# Patient Record
Sex: Male | Born: 1975
Health system: Southern US, Community
[De-identification: ages and names within clinical notes are randomized; demographics above are authoritative.]

## PROBLEM LIST (undated history)

## (undated) DIAGNOSIS — Z8619 Personal history of other infectious and parasitic diseases: Secondary | ICD-10-CM

## (undated) DIAGNOSIS — Z87898 Personal history of other specified conditions: Secondary | ICD-10-CM

## (undated) DIAGNOSIS — N5089 Other specified disorders of the male genital organs: Secondary | ICD-10-CM

## (undated) DIAGNOSIS — I10 Essential (primary) hypertension: Secondary | ICD-10-CM

## (undated) DIAGNOSIS — K219 Gastro-esophageal reflux disease without esophagitis: Secondary | ICD-10-CM

## (undated) DIAGNOSIS — R350 Frequency of micturition: Secondary | ICD-10-CM

## (undated) HISTORY — DX: Gastro-esophageal reflux disease without esophagitis: K21.9

## (undated) HISTORY — PX: NO PAST SURGERIES: SHX2092

---

## 2012-11-16 ENCOUNTER — Emergency Department (HOSPITAL_COMMUNITY)
Admission: EM | Admit: 2012-11-16 | Discharge: 2012-11-16 | Disposition: A | Discharge status: Home / Self Care | Payer: Self-pay | Attending: Emergency Medicine | Admitting: Emergency Medicine

## 2012-11-16 ENCOUNTER — Encounter (HOSPITAL_COMMUNITY): Payer: Self-pay | Admitting: *Deleted

## 2012-11-16 ENCOUNTER — Emergency Department (HOSPITAL_COMMUNITY): Payer: Self-pay

## 2012-11-16 DIAGNOSIS — Z87891 Personal history of nicotine dependence: Secondary | ICD-10-CM | POA: Insufficient documentation

## 2012-11-16 DIAGNOSIS — R059 Cough, unspecified: Secondary | ICD-10-CM | POA: Insufficient documentation

## 2012-11-16 DIAGNOSIS — R0789 Other chest pain: Secondary | ICD-10-CM | POA: Insufficient documentation

## 2012-11-16 DIAGNOSIS — R209 Unspecified disturbances of skin sensation: Secondary | ICD-10-CM | POA: Insufficient documentation

## 2012-11-16 DIAGNOSIS — R05 Cough: Secondary | ICD-10-CM | POA: Insufficient documentation

## 2012-11-16 LAB — COMPREHENSIVE METABOLIC PANEL
ALT: 32 U/L (ref 0–53)
AST: 24 U/L (ref 0–37)
Albumin: 3.9 g/dL (ref 3.5–5.2)
Alkaline Phosphatase: 104 U/L (ref 39–117)
BUN: 10 mg/dL (ref 6–23)
Calcium: 9.4 mg/dL (ref 8.4–10.5)
Chloride: 101 mEq/L (ref 96–112)
Potassium: 3.5 mEq/L (ref 3.5–5.1)
Sodium: 139 mEq/L (ref 135–145)
Total Bilirubin: 0.3 mg/dL (ref 0.3–1.2)
Total Protein: 7.6 g/dL (ref 6.0–8.3)

## 2012-11-16 LAB — CBC
HCT: 43.6 % (ref 39.0–52.0)
MCV: 84.2 fL (ref 78.0–100.0)
Platelets: 245 10*3/uL (ref 150–400)
RDW: 13.3 % (ref 11.5–15.5)
WBC: 7.4 10*3/uL (ref 4.0–10.5)

## 2012-11-16 MED ORDER — NITROGLYCERIN 0.4 MG SL SUBL
0.4000 mg | SUBLINGUAL_TABLET | SUBLINGUAL | Status: DC | PRN
  Filled 2012-11-16 (×2): qty 25

## 2012-11-16 MED ORDER — BENZONATATE 100 MG PO CAPS: 100.0000 mg | ORAL_CAPSULE | Freq: Three times a day (TID) | ORAL | Status: DC | PRN

## 2012-11-16 MED ORDER — ASPIRIN 81 MG PO CHEW
324.0000 mg | CHEWABLE_TABLET | Freq: Once | ORAL | Status: AC
  Administered 2012-11-16: 324 mg via ORAL
  Filled 2012-11-16: qty 4

## 2012-11-16 MED ORDER — METHOCARBAMOL 500 MG PO TABS: 1000.0000 mg | ORAL_TABLET | Freq: Four times a day (QID) | ORAL | Status: DC | PRN

## 2012-11-16 NOTE — ED Notes (Signed)
Pt c/o L sided chest pain since this morning. Describes pain as a squeezing.  Pt is non-diaphoretic and denies sob, but c/o some nausea.

## 2012-11-16 NOTE — ED Provider Notes (Signed)
Medical screening examination/treatment/procedure(s) were conducted as a shared visit with non-physician practitioner(s) and myself.  I personally evaluated the patient during the encounter.   Please see my separate note.     Candyce Churn, MD 11/16/12 [phone]

## 2012-11-16 NOTE — ED Provider Notes (Signed)
CSN: [id number]     Arrival date & time 11/16/12  1622 History   First MD Initiated Contact with Patient 11/16/12 1634     Chief Complaint  Patient presents with  . Chest Pain   (Consider location/radiation/quality/duration/timing/severity/associated sxs/prior Treatment) HPI  Rayon Barrie Dunker is a 37 y.o. male complaining of 2x episodes of palpitations starting yesterday and left sided CP, described as pressure-like and tight onset 08:30 AM today while Pt was working (non-physically strenuous job Horticulturist, commercial). Pain is 5/10, it has been constant but is exacerbated by deep breathing and coughing associated with tingling sensation in lips. Pt denies fever, N/V, SOB, diaphoresis, h/o DVT/PE. Pt states he was stressed yesterday because he received a notice that his water was being turned off.  Pt is former smoker 3 pack years, quit 11 years ago. No other PMH, but Pt does not have PCP. Has lip swelling with ibuprofen, but has not had reaction with ASA.    History reviewed. No pertinent past medical history. History reviewed. No pertinent past surgical history. No family history on file. History  Substance Use Topics  . Smoking status: Former Games developer  . Smokeless tobacco: Not on file  . Alcohol Use: No    Review of Systems  10 systems reviewed and found to be negative, except as noted in the HPI   Allergies  Ibuprofen lip swelling  Home Medications  No current outpatient prescriptions on file. BP 136/89  Pulse 81  Temp(Src) 98.5 F (36.9 C) (Oral)  Resp 18  Ht 5\' 5"  (1.651 m)  Wt 180 lb 9.6 oz (81.92 kg)  BMI 30.05 kg/m2  SpO2 100% Physical Exam  Nursing note and vitals reviewed. Constitutional: He is oriented to person, place, and time. He appears well-developed and well-nourished. No distress.  HENT:  Head: Normocephalic.  Mouth/Throat: Oropharynx is clear and moist. No oropharyngeal exudate.  Eyes: Conjunctivae and EOM are normal. Pupils are equal, round, and  reactive to light.  Neck: Neck supple. No JVD present.  Cardiovascular: Normal rate, regular rhythm and intact distal pulses.   Pulmonary/Chest: Effort normal and breath sounds normal. No stridor. No respiratory distress. He has no wheezes. He has no rales. He exhibits no tenderness.  Musculoskeletal: Normal range of motion. He exhibits no edema.  No calf asymmetry, superficial collaterals, palpable cords, edema, Homans sign negative bilaterally.    Neurological: He is alert and oriented to person, place, and time.  Psychiatric: He has a normal mood and affect.    ED Course  Procedures (including critical care time) Labs Review Labs Reviewed  CBC  COMPREHENSIVE METABOLIC PANEL  POCT I-STAT TROPONIN I   Imaging Review No results found.   Date: 11/16/2012  Rate: 79  Rhythm: normal sinus rhythm  QRS Axis: normal  Intervals: normal  ST/T Wave abnormalities: normal  Conduction Disutrbances:none  Narrative Interpretation:   Old EKG Reviewed: none available    MDM   1. Atypical chest pain   2. Cough    Filed Vitals:   11/16/12 1628  BP: 136/89  Pulse: 81  Temp: 98.5 F (36.9 C)  TempSrc: Oral  Resp: 18  Height: 5\' 5"  (1.651 m)  Weight: 180 lb 9.6 oz (81.92 kg)  SpO2: 100%     Youcef Barrie Dunker is a 38 y.o. male with left sided CP onset at 08:30 AM, preceded by palpitations yesterday. ECG is nonischemic and troponin is negative. Pt is low risk by wells criteria and PERC negative. Blood work and troponin are  negative. CXR shows no infiltrate. Doubt cardiac origin as pain has been constant since 08:30 AM and troponin is negative. likely chest wall inflamation from cough.   Medications  nitroGLYCERIN (NITROSTAT) SL tablet 0.4 mg (0.4 mg Sublingual Not Given 11/16/12 1730)  aspirin chewable tablet 324 mg (324 mg Oral Given 11/16/12 1725)    Pt is hemodynamically stable, appropriate for, and amenable to discharge at this time. Pt verbalized understanding and agrees with  care plan. All questions answered. Outpatient follow-up and specific return precautions discussed.    New Prescriptions   BENZONATATE (TESSALON) 100 MG CAPSULE    Take 1 capsule (100 mg total) by mouth 3 (three) times daily as needed for cough.   METHOCARBAMOL (ROBAXIN) 500 MG TABLET    Take 2 tablets (1,000 mg total) by mouth 4 (four) times daily as needed (Pain).    Note: Portions of this report may have been transcribed using voice recognition software. Every effort was made to ensure accuracy; however, inadvertent computerized transcription errors may be present      Wynetta Emery, PA-C 11/16/12 1810

## 2012-11-16 NOTE — ED Provider Notes (Signed)
Medical screening examination/treatment/procedure(s) were conducted as a shared visit with non-physician practitioner(s) and myself.  I personally evaluated the patient during the encounter  37-year-old male with no significant past medical history presenting with chest pain and cough. Chest pain started this morning has been constant.  Well-appearing, lungs clear to auscultation bilaterally with good air movement, heart sounds normal with regular rate and rhythm. Mild chest tenderness on the left anterior chest. Story does not sound consistent with ACS or PE. Pertinent negative. Initial troponin negative and pain has been constant for many hours. Chest x-ray shows no pneumonia. Feel that he is safe for discharge.  Clinical Impression: 1. Atypical chest pain   2. Cough       Candyce Churn, MD 11/16/12 [id number]

## 2013-01-10 ENCOUNTER — Telehealth: Payer: Self-pay | Admitting: *Deleted

## 2013-01-10 ENCOUNTER — Encounter: Payer: Self-pay | Admitting: Internal Medicine

## 2013-01-10 ENCOUNTER — Ambulatory Visit: Payer: No Typology Code available for payment source | Attending: Internal Medicine | Admitting: Internal Medicine

## 2013-01-10 VITALS — BP 155/95 | HR 76 | Temp 98.8°F | Resp 14 | Ht 66.0 in | Wt 185.0 lb

## 2013-01-10 DIAGNOSIS — R1012 Left upper quadrant pain: Secondary | ICD-10-CM

## 2013-01-10 DIAGNOSIS — Z23 Encounter for immunization: Secondary | ICD-10-CM

## 2013-01-10 DIAGNOSIS — R51 Headache: Secondary | ICD-10-CM | POA: Insufficient documentation

## 2013-01-10 LAB — CBC WITH DIFFERENTIAL/PLATELET
Basophils Relative: 0 % (ref 0–1)
Eosinophils Relative: 2 % (ref 0–5)
Lymphocytes Relative: 28 % (ref 12–46)
Lymphs Abs: 1.5 10*3/uL (ref 0.7–4.0)
MCH: 30.4 pg (ref 26.0–34.0)
Monocytes Absolute: 0.5 10*3/uL (ref 0.1–1.0)
Neutro Abs: 3.2 10*3/uL (ref 1.7–7.7)
Neutrophils Relative %: 60 % (ref 43–77)
Platelets: 265 10*3/uL (ref 150–400)
RBC: 5.43 MIL/uL (ref 4.22–5.81)
WBC: 5.3 10*3/uL (ref 4.0–10.5)

## 2013-01-10 MED ORDER — TOPIRAMATE 25 MG PO TABS: 25.0000 mg | ORAL_TABLET | Freq: Two times a day (BID) | ORAL | Status: DC

## 2013-01-10 NOTE — Progress Notes (Signed)
Patient ID: Darren Burns, male   DOB: 05/25/1975, 37 y.o.   MRN: [id number]   CC:  HPI:  37 year old male who is here to establish care. The patient complains of left upper quadrant pain, worse with eating, not associated with any nausea vomiting but does have a lot of bloating. He denies use of alcohol. He denies any history of peptic ulcer disease. He denies any previous abdominal surgeries Denies any weight loss, hematochezia, he states that he has gained weight  He also complains of headache which is had for the last 4 years, symptoms are worse in the morning Associated with occasional dizziness, no changes in his vision or hearing   Allergies  Allergen Reactions  . Ibuprofen   . Tamiflu [Oseltamivir Phosphate] Nausea And Vomiting   History reviewed. No pertinent past medical history. Current Outpatient Prescriptions on File Prior to Visit  Medication Sig Dispense Refill  . benzonatate (TESSALON) 100 MG capsule Take 1 capsule (100 mg total) by mouth 3 (three) times daily as needed for cough.  21 capsule  0  . methocarbamol (ROBAXIN) 500 MG tablet Take 2 tablets (1,000 mg total) by mouth 4 (four) times daily as needed (Pain).  20 tablet  0   No current facility-administered medications on file prior to visit.   History reviewed. No pertinent family history. History   Social History  . Marital Status: Married    Spouse Name: N/A    Number of Children: N/A  . Years of Education: N/A   Occupational History  . Not on file.   Social History Main Topics  . Smoking status: Former Games developer  . Smokeless tobacco: Not on file  . Alcohol Use: No  . Drug Use: No  . Sexual Activity: Not on file   Other Topics Concern  . Not on file   Social History Narrative  . No narrative on file    Review of Systems  Constitutional: Negative for fever, chills, diaphoresis, activity change, appetite change and fatigue.  HENT: Negative for ear pain, nosebleeds, congestion, facial  swelling, rhinorrhea, neck pain, neck stiffness and ear discharge.   Eyes: Negative for pain, discharge, redness, itching and visual disturbance.  Respiratory: Negative for cough, choking, chest tightness, shortness of breath, wheezing and stridor.   Cardiovascular: Negative for chest pain, palpitations and leg swelling.  Gastrointestinal: Negative for abdominal distention.  Genitourinary: Negative for dysuria, urgency, frequency, hematuria, flank pain, decreased urine volume, difficulty urinating and dyspareunia.  Musculoskeletal: Negative for back pain, joint swelling, arthralgias and gait problem.  Neurological: Negative for dizziness, tremors, seizures, syncope, facial asymmetry, speech difficulty, weakness, light-headedness, numbness and headaches.  Hematological: Negative for adenopathy. Does not bruise/bleed easily.  Psychiatric/Behavioral: Negative for hallucinations, behavioral problems, confusion, dysphoric mood, decreased concentration and agitation.    Objective:   Filed Vitals:   01/10/13 0950  BP: 155/95  Pulse: 76  Temp: 98.8 F (37.1 C)  Resp: 14    Physical Exam  Constitutional: Appears well-developed and well-nourished. No distress.  HENT: Normocephalic. External right and left ear normal. Oropharynx is clear and moist.  Eyes: Conjunctivae and EOM are normal. PERRLA, no scleral icterus.  Neck: Normal ROM. Neck supple. No JVD. No tracheal deviation. No thyromegaly.  CVS: RRR, S1/S2 +, no murmurs, no gallops, no carotid bruit.  Pulmonary: Effort and breath sounds normal, no stridor, rhonchi, wheezes, rales.  Abdominal: Soft. BS +,  no distension, tenderness, rebound or guarding.  Musculoskeletal: Normal range of motion. No edema and no tenderness.  Lymphadenopathy: No lymphadenopathy noted, cervical, inguinal. Neuro: Alert. Normal reflexes, muscle tone coordination. No cranial nerve deficit. Skin: Skin is warm and dry. No rash noted. Not diaphoretic. No erythema. No  pallor.  Psychiatric: Normal mood and affect. Behavior, judgment, thought content normal.   Lab Results  Component Value Date   WBC 7.4 11/16/2012   HGB 15.9 11/16/2012   HCT 43.6 11/16/2012   MCV 84.2 11/16/2012   PLT 245 11/16/2012   Lab Results  Component Value Date   CREATININE 0.94 11/16/2012   BUN 10 11/16/2012   NA 139 11/16/2012   K 3.5 11/16/2012   CL 101 11/16/2012   CO2 27 11/16/2012    No results found for this basename: HGBA1C   Lipid Panel  No results found for this basename: chol, trig, hdl, cholhdl, vldl, ldlcalc       Assessment and plan:   There are no active problems to display for this patient.      chronic headache We'll obtain a CT without contrast and start the patient on Topamax for headache prophylaxis If no improvement the patient will be referred to neurology   Chronic abdominal pain left upper quadrant Limited CT abdomen pelvis obtain baseline labs including CMP, kidney function  The patient also wants to see a urologist for vasectomy as well as testicular swelling   The patient was given clear instructions to go to ER or return to medical center if symptoms don't improve, worsen or new problems develop. The patient verbalized understanding. The patient was told to call to get any lab results if not heard anything in the next week.

## 2013-01-10 NOTE — Telephone Encounter (Signed)
Called pt and received no answer. Left a message on the voicemail informing pt of his CT scan appointment on 01/18/13 AT 9:30am with all instructions for appointment.

## 2013-01-10 NOTE — Progress Notes (Signed)
Pt is here to establish care. Pt complains of constant throbbing headaches x2 days in the RT side of forehead above eyebrow. Pt also complains of LT side abdominal pain after eating/drinking x3 months; discomfort and stabbing pain, feels more like inflammation. Pt has had moments of pre-hypertension in the past few weeks. BP today is 155/95; may be causing his headaches.

## 2013-01-11 LAB — COMPLETE METABOLIC PANEL WITH GFR
BUN: 12 mg/dL (ref 6–23)
CO2: 28 mEq/L (ref 19–32)
Calcium: 9.5 mg/dL (ref 8.4–10.5)
Chloride: 103 mEq/L (ref 96–112)
Creat: 0.76 mg/dL (ref 0.50–1.35)
GFR, Est African American: >89 mL/min
Glucose, Bld: 86 mg/dL (ref 70–99)
Sodium: 137 mEq/L (ref 135–145)

## 2013-01-18 ENCOUNTER — Ambulatory Visit (HOSPITAL_COMMUNITY)
Admission: RE | Admit: 2013-01-18 | Discharge: 2013-01-18 | Disposition: A | Discharge status: Home / Self Care | Payer: No Typology Code available for payment source | Source: Ambulatory Visit | Attending: Internal Medicine | Admitting: Internal Medicine

## 2013-01-18 ENCOUNTER — Encounter (HOSPITAL_COMMUNITY): Payer: Self-pay

## 2013-01-18 DIAGNOSIS — K5730 Diverticulosis of large intestine without perforation or abscess without bleeding: Secondary | ICD-10-CM | POA: Insufficient documentation

## 2013-01-18 DIAGNOSIS — R1012 Left upper quadrant pain: Secondary | ICD-10-CM

## 2013-01-18 DIAGNOSIS — R109 Unspecified abdominal pain: Secondary | ICD-10-CM | POA: Insufficient documentation

## 2013-01-18 DIAGNOSIS — K429 Umbilical hernia without obstruction or gangrene: Secondary | ICD-10-CM | POA: Insufficient documentation

## 2013-01-18 MED ORDER — IOHEXOL 300 MG/ML  SOLN
80.0000 mL | Freq: Once | INTRAMUSCULAR | Status: AC | PRN
  Administered 2013-01-18: 80 mL via INTRAVENOUS

## 2013-02-12 ENCOUNTER — Ambulatory Visit: Payer: No Typology Code available for payment source | Admitting: Internal Medicine

## 2013-04-11 ENCOUNTER — Ambulatory Visit: Payer: No Typology Code available for payment source | Admitting: Internal Medicine

## 2013-06-18 ENCOUNTER — Emergency Department (HOSPITAL_COMMUNITY)
Admission: EM | Admit: 2013-06-18 | Discharge: 2013-06-18 | Disposition: A | Discharge status: Home / Self Care | Payer: No Typology Code available for payment source | Source: Home / Self Care | Attending: Emergency Medicine | Admitting: Emergency Medicine

## 2013-06-18 ENCOUNTER — Encounter (HOSPITAL_COMMUNITY): Payer: Self-pay | Admitting: Emergency Medicine

## 2013-06-18 DIAGNOSIS — J019 Acute sinusitis, unspecified: Secondary | ICD-10-CM

## 2013-06-18 DIAGNOSIS — R109 Unspecified abdominal pain: Secondary | ICD-10-CM

## 2013-06-18 DIAGNOSIS — G5600 Carpal tunnel syndrome, unspecified upper limb: Secondary | ICD-10-CM

## 2013-06-18 DIAGNOSIS — G44209 Tension-type headache, unspecified, not intractable: Secondary | ICD-10-CM

## 2013-06-18 MED ORDER — TIZANIDINE HCL 4 MG PO TABS: ORAL_TABLET | ORAL | Status: DC

## 2013-06-18 MED ORDER — AMITRIPTYLINE HCL 25 MG PO TABS: 25.0000 mg | ORAL_TABLET | Freq: Every day | ORAL | Status: DC

## 2013-06-18 MED ORDER — AMOXICILLIN 500 MG PO CAPS: 1000.0000 mg | ORAL_CAPSULE | Freq: Three times a day (TID) | ORAL | Status: DC

## 2013-06-18 NOTE — ED Notes (Signed)
C/o  Headache, back left side of head radiating to the front of the head.  Pressure behind left eye.   Also having in numbness in left hand/left side of body off/on.   BP elevated when checked at [facility].  Denies hx of hypertension.

## 2013-06-18 NOTE — ED Provider Notes (Signed)
Chief Complaint   Chief Complaint  Patient presents with  . Headache  . Numbness    History of Present Illness   Darren Burns is a 38 year old male who presents today with headaches and a number of other problems. The current headache has been going on for about 2 weeks, but he has had headaches going on since at least last fall when he was seen last by his primary care physician. He describes the headache as left occipital and nuchal radiating around to the frontal area. Comes and goes and is worse with flexion of the neck. He feels pressure behind both eyes and dry eyes. His left hand is numb in the morning when he first wakes up. This is particularly noticeable over the thumb, index, and middle fingers. This tends to go away if he moves around or shakes his hands. His feet feel numb off and on. He's had occasional heart flutters and chest tightness. He feels stressed and anxious. He's having difficulty sleeping at night. He's had some blurring of his vision lasting a few seconds at a time. He's felt chills. He also has had some pain in his left flank area. He was seen for these problems and others by her primary care physician last November. He had a complete workup including CT scan of the abdomen and head, complete blood work including a troponin all of which were negative. His EKG was within normal limits. He was given Topamax and Robaxin and told to followup with urology and neurology. He's not been able to make any these followups because of cost. The patient had a concussion as a child. He's not on anticoagulants and had no other trauma. He has not had fever or stiff neck. The headaches come on gradually not suddenly. No one else in the house is been sick. He denies any eye redness or pain. No injury to the neck. He denies any jaw claudication. He's had no difficulty with speech, swallowing, ambulation, or weakness of his arms or legs.  Review of Systems   Other than as noted above,  the patient denies any of the following symptoms: Systemic:  No fever, chills, photophobia, or stiff neck. Eye:  No blurred vision, or diplopia. ENT:  No nasal congestion, rhinorrhea, sinus pressure or pain, or sore throat.  No jaw claudication. Neuro:  No paresthesias, loss of consciousness, seizure activity, muscle weakness, trouble with coordination or gait, trouble speaking or swallowing. Psych:  No depression, anxiety or trouble sleeping.  [place]   Past medical history, family history, social history, meds, and allergies were reviewed.    Physical Examination    Vital signs:  BP 138/97  Pulse 75  Temp(Src) 98.1 F (36.7 C) (Oral)  Resp 16  SpO2 100% General:  Alert and oriented.  In no distress. Eye:  Lids and conjunctivas normal.  PERRL,  Full EOMs.  Fundi benign with normal discs and vessels. There was no papilledema.  ENT:  No cranial or facial tenderness to palpation.  The left TM was slightly erythematous, right TM was normal.  Nasal mucosa was normal and uncongested without any drainage. No intra oral lesions, pharynx was erythematous with cobblestoning, mucous membranes moist, dentition normal. Neck:  Supple, full ROM, no tenderness to palpation.  No adenopathy or mass. Lungs: Clear to auscultation. Heart: Regular, no gallop or murmur. Abdomen: Soft, nontender, no organomegaly or mass. Extremities: No edema, pulses were full , Tinel's sign was positive on the left. Neuro:  Alert and orented times 3.  Speech was clear, fluent, and appropriate.  Cranial nerves intact. No pronator drift, muscle strength normal. Sensation was normal to light touch bilaterally in the hands and the feet. Finger to nose normal.  DTRs were 2+ and symmetrical.Station and gait were normal.  Romberg's sign was normal.  Able to perform tandem gait well. Psych:  Normal affect.  Assessment   The primary encounter diagnosis was Acute sinusitis. Diagnoses of Tension type headache, Carpal tunnel syndrome,  and Left flank pain were also pertinent to this visit.  There is no evidence of subarachnoid hemorrhage, meningitis, encephalitis, epidural hematoma, acute glaucoma, carotid dissection, temporal arteritis, cavernous sinus thrombosis, carbon monoxide toxicity, or pseudotumor cerebri.    Plan   1.  Meds:  The following meds were prescribed:   Discharge Medication List as of 06/18/2013 10:51 AM    START taking these medications   Details  amitriptyline (ELAVIL) 25 MG tablet Take 1 tablet (25 mg total) by mouth at bedtime., Starting 06/18/2013, Until Discontinued, Normal    amoxicillin (AMOXIL) 500 MG capsule Take 2 capsules (1,000 mg total) by mouth 3 (three) times daily., Starting 06/18/2013, Until Discontinued, Normal    tiZANidine (ZANAFLEX) 4 MG tablet 1 every 8 hours for head or neck pain, Normal        2.  Patient Education/Counseling:  The patient was given appropriate handouts, self care instructions, and instructed in symptomatic relief.  He will need followup with his primary care physician. Should make an appointment in about 2 weeks.  3.  Follow up:  The patient was told to follow up here if no better in 2 to 3 days, or sooner if becoming worse in any way, and given some red flag symptoms such as fever, worsening pain, persistent vomiting, or new neurological symptoms which would prompt immediate return.       Harden Mo, MD 06/18/13 [phone]

## 2013-06-18 NOTE — Discharge Instructions (Signed)
Carpal tunnel syndrome is caused by compression of the median nerve in the wrist.  Most often, inflammation of the tendons that pass through the carpal tunnel and surround the median nerve is the causative factor. ° °Wear your wrist splint as much as possible, especially at night.   ° °The following exercises should be done twice daily: ° °Exercises for Carpal Tunnel Syndrome  °Wrists Exercise 1. °• Make a loose right fist, palm up, and use the left hand to press gently down against the clenched hand. °• Resist the force with the closed right hand for 5 seconds. Be sure to keep the wrist straight. °• Turn the right fist palm down, and press the knuckles against the left open palm for 5 seconds. °• Finally, turn the right palm so the thumb-side of the fist is up, and press down again for 5 seconds. °• Repeat with the left hand.  ° Exercise 2. °• Hold one hand straight up shoulder-high with fingers together and palm facing outward. (The position looks like a shoulder-high salute.) °• With the other hand, bend the hand being exercised backward with the fingers still held together and hold for 5 seconds. °• Spread the fingers and thumb open while the hand is still bent back and hold for 5 seconds. °• Repeat five times for each hand.  ° Exercise 3. (Wrist Circle) °• Hold the second and third fingers up, and close the others. °• Draw five clockwise circles in the air with the two finger tips. °• Draw five more counterclockwise circles. °• Repeat with the other hand.  °Fingers and Hand Exercise 1. °• Clench the fingers of one hand into a fist tightly. °• Release, fanning out the fingers. °• Do this five times. Repeat with the other hand.  ° Exercise 2. °• To exercise the thumb, bend it against the palm beneath the little finger, and hold for 5 seconds. °• Spread the fingers apart, palm up, and hold for 5 seconds. °• Repeat five to 10 times with each hand.  ° Exercise 3. °• Gently pull the thumb out and back and hold for 5  seconds. °• Repeat five to 10 times with each hand.  °Forearms (stretching these muscles will reduce tension in the wrist) • Place the hands together in front of the chest, fingers pointed upward in a prayer-like position. °• Keeping the palms flat together, raise the elbows to stretch the forearm muscles. °• Stretch for 10 seconds. °• Gently shake the hands limp for a few seconds to loosen them. °• Repeat frequently when the hands or arms tire from activity.  °Neck and Shoulders Exercise 1. °• Sit upright and place the right hand on top of the left shoulder. °• Hold that shoulder down, and slowly tip the head down toward the right. °• Keep the face pointed forward, or even turned slightly toward the right. °• Hold this stretch gently for 5 seconds. °• Repeat on the other side.  ° Exercise 2. °• Stand in a relaxed position with the arms at the side. °• Shrug the shoulders up, then squeeze the shoulders back, then stretch the shoulders down, and then press them forward. °• The entire exercise should take about 7 seconds.  ° ° °If you are employed in a job that requires repetitive hand or wrist movement (this includes keyboarding or use of a computer mouse) paying attention to work ergonomics is of utmost importance: ° °Preventing CTS in Keyboard Workers °Altering the way a person performs repetitive   activities may help prevent inflammation in the hand and wrist. Most of the interventions described below have been found to reduce repetitive motion problems in the muscles and tendons of the hand and arm. They may reduce the incidence of carpal tunnel syndrome, although there is no definite proof of this effect. °Replacing old tools with ergonomically designed new ones can be very helpful. °Rest Periods and Avoiding Repetition. Anyone who does repetitive tasks should begin with a short warm-up period, take frequent breaks, and avoid overexertion of the hand and finger muscles whenever possible. Employers should be urged  to vary the tasks and work content of their employees. °Taking multiple "microbreaks" (about 3 minutes each) reduces strain and discomfort without decreasing productivity. Such breaks may include the following: °• Shaking or stretching the limbs °• Leaning back in the chair °• Squeezing the shoulder blades together. °• Taking deep breaths °Good Posture. Good posture is extremely important in preventing carpal tunnel syndrome, particularly for typists and computer users. °• The worker should sit with the spine against the back of the chair with the shoulders relaxed. °• The elbows should rest along the sides of the body, with wrists straight. °• The feet should be firmly on the floor or on a footrest. °• Typing materials should be at eye level so that the neck does not bend over the work. °• Keeping the neck flexible and head upright maintains circulation and nerve function to the arms and hands. One method for finding the correct head position is the "pigeon" movement. Keeping the chin level, glide the head slowly and gently forward and backward in small movements, avoiding neck discomfort. °Good Office Furniture. Poorly designed office furniture is a major contributor to bad posture. Chairs should be adjustable for height, with a supportive backrest. Custom-designed chairs, made for people who do not fit in standard chairs, can be expensive. However, the costs are often offset by the savings in medical expenses that follow injuries related to bad posture. °Voice Recognition Software. For CTS patients who must use a computer frequently, a variety of voice recognition software packages (ViaVoice, Voice Xpress, Dragon NaturallySpeaking, IListen) are now available, enabling virtually hands-free computer use. °Keyboard and Mouse Tips. Anyone using a keyboard and mouse has some options that may help protect the hands. °• The tension of the keys should be adjusted so they can be depressed without excessive force. °• The  hands and wrists should remain in a relaxed position to avoid excessive force on the keyboard. °• A 2003 study suggested that mouse-use poses a higher risk than keyboard use. Replacing the mouse with a trackball device and the standard keyboard with a jointed-type keyboard are helpful substitutions. °• Wrist rests, which fit under most keyboards, can help keep the wrists and fingers in a comfortable position. °• Some people recommend keeping the computer mouse as close to the keyboard and the user's body as possible, to reduce shoulder muscle movement. °• The mouse should be held lightly, with the wrist and forearm relaxed. New mouse supports are also available that relieve stress on the hand and support the wrist. °• Some people cut their mouse pads in half to reduce movement. °Innovative keyboard designs may reduce hand stress: °• Alternative geometry keyboards (Microsoft Natural Keyboard, Apple Adjustable Keyboard) allow the user to adjust and modify hand positions as well as adjust key tension. Most have a split or "slanted" keyboard that places the wrists at an angle. Studies suggest they are useful in promoting a neutral position   stress:   Alternative geometry keyboards (Microsoft Natural Keyboard, Apple Adjustable Keyboard) allow the user to adjust and modify hand positions as well as adjust key tension. Most have a split or "slanted" keyboard that places the wrists at an angle. Studies suggest they are useful in promoting a neutral position for the wrist.   The continuous passive motion (CPM) keyboard lifts and declines gently and automatically every 3 minutes to break tension on the hands and wrist.   A keyless keyboard (orbiTouch) is an innovative device that uses two domes. The typist covers the domes with their hands and slides them into different positions that represent letters. Reducing Force from [employer] The force placed on the fingers, hands, and wrists by a repetitive task is an important contributor to CTS. To alleviate the effect of force on the wrist, tools and tasks should be designed so that the wrist position is the same as it would be if the arms dangled in a relaxed manner at the sides.   No task should require the wrist to deviate from side to side or to remain flexed or highly extended for  long periods.   The handles of hand tools such as screwdrivers, scrapers, paint brushes, and buffers should be designed so that the force of the worker's grip is distributed across the muscle between the base of the thumb and the little finger, not just in the center of the palm.   People who need to hold tools (including pencils and steering wheels) for long periods of time should grip them as loosely as possible.   In order to apply force appropriately, the ability to feel an object is extremely important. Tools with textured handles are helpful.   If possible, people should avoid working at low temperatures, which reduces sensation in hands and fingers.   Power tools and machines should be designed to minimize vibrations.   Wearing thick gloves, when possible, may lessen the shock transmitted to the hands and wrists.    Tension Headache A tension headache is a feeling of pain, pressure, or aching often felt over the front and sides of the head. The pain can be dull or can feel tight (constricting). It is the most common type of headache. Tension headaches are not normally associated with nausea or vomiting and do not get worse with physical activity. Tension headaches can last 30 minutes to several days.  CAUSES  The exact cause is not known, but it may be caused by chemicals and hormones in the brain that lead to pain. Tension headaches often begin after stress, anxiety, or depression. Other triggers may include:  Alcohol.  Caffeine (too much or withdrawal).  Respiratory infections (colds, flu, sinus infections).  Dental problems or teeth clenching.  Fatigue.  Holding your head and neck in one position too long while using a computer. SYMPTOMS   Pressure around the head.   Dull, aching head pain.   Pain felt over the front and sides of the head.   Tenderness in the muscles of the head, neck, and shoulders. DIAGNOSIS  A tension headache is often diagnosed based on:    Symptoms.   Physical examination.   A CT scan or MRI of your head. These tests may be ordered if symptoms are severe or unusual. TREATMENT  Medicines may be given to help relieve symptoms.  HOME CARE INSTRUCTIONS   Only take over-the-counter or prescription medicines for pain or discomfort as directed by your caregiver.   Lie down in  a dark, quiet room when you have a headache.   Keep a journal to find out what may be triggering your headaches. For example, write down:  What you eat and drink.  How much sleep you get.  Any change to your diet or medicines.  Try massage or other relaxation techniques.   Ice packs or heat applied to the head and neck can be used. Use these 3 to 4 times per day for 15 to 20 minutes each time, or as needed.   Limit stress.   Sit up straight, and do not tense your muscles.   Quit smoking if you smoke.  Limit alcohol use.  Decrease the amount of caffeine you drink, or stop drinking caffeine.  Eat and exercise regularly.  Get 7 to 9 hours of sleep, or as recommended by your caregiver.  Avoid excessive use of pain medicine as recurrent headaches can occur.  SEEK MEDICAL CARE IF:   You have problems with the medicines you were prescribed.  Your medicines do not work.  You have a change from the usual headache.  You have nausea or vomiting. SEEK IMMEDIATE MEDICAL CARE IF:   Your headache becomes severe.  You have a fever.  You have a stiff neck.  You have loss of vision.  You have muscular weakness or loss of muscle control.  You lose your balance or have trouble walking.  You feel faint or pass out.  You have severe symptoms that are different from your first symptoms. MAKE SURE YOU:   Understand these instructions.  Will watch your condition.  Will get help right away if you are not doing well or get worse. Document Released: 02/01/2005 Document Revised: 04/26/2011 Document Reviewed: 01/22/2011 [facility]  Patient Information 2014 [place], [place].  Sinusitis Sinusitis is redness, soreness, and swelling (inflammation) of the paranasal sinuses. Paranasal sinuses are air pockets within the bones of your face (beneath the eyes, the middle of the forehead, or above the eyes). In healthy paranasal sinuses, mucus is able to drain out, and air is able to circulate through them by way of your nose. However, when your paranasal sinuses are inflamed, mucus and air can become trapped. This can allow bacteria and other germs to grow and cause infection. Sinusitis can develop quickly and last only a short time (acute) or continue over a long period (chronic). Sinusitis that lasts for more than 12 weeks is considered chronic.  CAUSES  Causes of sinusitis include:  Allergies.  Structural abnormalities, such as displacement of the cartilage that separates your nostrils (deviated septum), which can decrease the air flow through your nose and sinuses and affect sinus drainage.  Functional abnormalities, such as when the small hairs (cilia) that line your sinuses and help remove mucus do not work properly or are not present. SYMPTOMS  Symptoms of acute and chronic sinusitis are the same. The primary symptoms are pain and pressure around the affected sinuses. Other symptoms include:  Upper toothache.  Earache.  Headache.  Bad breath.  Decreased sense of smell and taste.  A cough, which worsens when you are lying flat.  Fatigue.  Fever.  Thick drainage from your nose, which often is green and may contain pus (purulent).  Swelling and warmth over the affected sinuses. DIAGNOSIS  Your caregiver will perform a physical exam. During the exam, your caregiver may:  Look in your nose for signs of abnormal growths in your nostrils (nasal polyps).  Tap over the affected sinus to check for signs  of infection.  View the inside of your sinuses (endoscopy) with a special imaging device with a light attached  (endoscope), which is inserted into your sinuses. If your caregiver suspects that you have chronic sinusitis, one or more of the following tests may be recommended:  Allergy tests.  Nasal culture A sample of mucus is taken from your nose and sent to a lab and screened for bacteria.  Nasal cytology A sample of mucus is taken from your nose and examined by your caregiver to determine if your sinusitis is related to an allergy. TREATMENT  Most cases of acute sinusitis are related to a viral infection and will resolve on their own within 10 days. Sometimes medicines are prescribed to help relieve symptoms (pain medicine, decongestants, nasal steroid sprays, or saline sprays).  However, for sinusitis related to a bacterial infection, your caregiver will prescribe antibiotic medicines. These are medicines that will help kill the bacteria causing the infection.  Rarely, sinusitis is caused by a fungal infection. In theses cases, your caregiver will prescribe antifungal medicine. For some cases of chronic sinusitis, surgery is needed. Generally, these are cases in which sinusitis recurs more than 3 times per year, despite other treatments. HOME CARE INSTRUCTIONS   Drink plenty of water. Water helps thin the mucus so your sinuses can drain more easily.  Use a humidifier.  Inhale steam 3 to 4 times a day (for example, sit in the bathroom with the shower running).  Apply a warm, moist washcloth to your face 3 to 4 times a day, or as directed by your caregiver.  Use saline nasal sprays to help moisten and clean your sinuses.  Take over-the-counter or prescription medicines for pain, discomfort, or fever only as directed by your caregiver. SEEK IMMEDIATE MEDICAL CARE IF:  You have increasing pain or severe headaches.  You have nausea, vomiting, or drowsiness.  You have swelling around your face.  You have vision problems.  You have a stiff neck.  You have difficulty breathing. MAKE SURE  YOU:   Understand these instructions.  Will watch your condition.  Will get help right away if you are not doing well or get worse. Document Released: 02/01/2005 Document Revised: 04/26/2011 Document Reviewed: 02/16/2011 [facility] Patient Information 2014 [place], [place].

## 2013-07-04 ENCOUNTER — Ambulatory Visit: Payer: Self-pay | Attending: Internal Medicine

## 2013-07-31 ENCOUNTER — Ambulatory Visit: Payer: Self-pay | Admitting: Internal Medicine

## 2013-10-25 ENCOUNTER — Encounter: Payer: Self-pay | Admitting: Internal Medicine

## 2013-10-25 ENCOUNTER — Ambulatory Visit: Payer: Self-pay | Attending: Internal Medicine | Admitting: Internal Medicine

## 2013-10-25 VITALS — BP 127/81 | HR 76 | Temp 98.2°F | Ht 66.0 in | Wt 178.6 lb

## 2013-10-25 DIAGNOSIS — Z87891 Personal history of nicotine dependence: Secondary | ICD-10-CM | POA: Insufficient documentation

## 2013-10-25 DIAGNOSIS — N451 Epididymitis: Secondary | ICD-10-CM

## 2013-10-25 DIAGNOSIS — N453 Epididymo-orchitis: Secondary | ICD-10-CM

## 2013-10-25 DIAGNOSIS — Z0000 Encounter for general adult medical examination without abnormal findings: Secondary | ICD-10-CM

## 2013-10-25 DIAGNOSIS — K029 Dental caries, unspecified: Secondary | ICD-10-CM

## 2013-10-25 LAB — CBC WITH DIFFERENTIAL/PLATELET
BASOS ABS: 0 10*3/uL (ref 0.0–0.1)
Basophils Relative: 0 % (ref 0–1)
Eosinophils Absolute: 0.1 10*3/uL (ref 0.0–0.7)
Eosinophils Relative: 1 % (ref 0–5)
HCT: 44.4 % (ref 39.0–52.0)
HEMOGLOBIN: 16 g/dL (ref 13.0–17.0)
Lymphocytes Relative: 30 % (ref 12–46)
Lymphs Abs: 1.6 10*3/uL (ref 0.7–4.0)
MCH: 30.5 pg (ref 26.0–34.0)
MCHC: 36 g/dL (ref 30.0–36.0)
MCV: 84.7 fL (ref 78.0–100.0)
MONOS PCT: 10 % (ref 3–12)
Monocytes Absolute: 0.5 10*3/uL (ref 0.1–1.0)
NEUTROS ABS: 3.1 10*3/uL (ref 1.7–7.7)
NEUTROS PCT: 59 % (ref 43–77)
Platelets: 261 10*3/uL (ref 150–400)
RBC: 5.24 MIL/uL (ref 4.22–5.81)
RDW: 13.8 % (ref 11.5–15.5)
WBC: 5.2 10*3/uL (ref 4.0–10.5)

## 2013-10-25 LAB — COMPLETE METABOLIC PANEL WITH GFR
ALBUMIN: 4.2 g/dL (ref 3.5–5.2)
ALT: 36 U/L (ref 0–53)
AST: 26 U/L (ref 0–37)
Alkaline Phosphatase: 74 U/L (ref 39–117)
BUN: 9 mg/dL (ref 6–23)
CALCIUM: 9.2 mg/dL (ref 8.4–10.5)
CHLORIDE: 103 meq/L (ref 96–112)
CO2: 28 mEq/L (ref 19–32)
CREATININE: 0.84 mg/dL (ref 0.50–1.35)
GFR, Est Non African American: >89 mL/min
GLUCOSE: 83 mg/dL (ref 70–99)
Potassium: 4.2 mEq/L (ref 3.5–5.3)
Sodium: 139 mEq/L (ref 135–145)
Total Bilirubin: 0.5 mg/dL (ref 0.2–1.2)
Total Protein: 7.1 g/dL (ref 6.0–8.3)

## 2013-10-25 LAB — LIPID PANEL
Cholesterol: 144 mg/dL (ref 0–200)
HDL: 36 mg/dL — ABNORMAL LOW (ref >39)
LDL Cholesterol: 81 mg/dL (ref 0–99)
TRIGLYCERIDES: 135 mg/dL (ref <150)
Total CHOL/HDL Ratio: 4 Ratio
VLDL: 27 mg/dL (ref 0–40)

## 2013-10-25 LAB — TSH: TSH: 1.317 u[IU]/mL (ref 0.350–4.500)

## 2013-10-25 MED ORDER — DOXYCYCLINE HYCLATE 100 MG PO TABS: 100.0000 mg | ORAL_TABLET | Freq: Two times a day (BID) | ORAL | Status: DC

## 2013-10-25 NOTE — Progress Notes (Signed)
Patient Demographics  Darren Burns, is a 38 y.o. male  EVO:[id number]  EXH:[id number]  DOB - 05-01-1975  CC:  Chief Complaint  Patient presents with  . Annual Exam       HPI: Darren Burns is a 38 y.o. male here today for annual physical examination. Patient reported to have lot of dental cavities and is requesting referral to see a dentist, he also reported to have some tenderness  in his scrotum/testes, denies any fever chills denies any urinary symptoms, denies any trauma. In the past patient had flank pain/abdominal pain, had CT scan done which was negative. Patient has No headache, No chest pain, No abdominal pain - No Nausea, No new weakness tingling or numbness, No Cough - SOB.  Allergies  Allergen Reactions  . Ibuprofen   . Tamiflu [Oseltamivir Phosphate] Nausea And Vomiting   History reviewed. No pertinent past medical history. No current outpatient prescriptions on file prior to visit.   No current facility-administered medications on file prior to visit.   No family history on file. History   Social History  . Marital Status: Married    Spouse Name: N/A    Number of Children: N/A  . Years of Education: N/A   Occupational History  . Not on file.   Social History Main Topics  . Smoking status: Former Research scientist (life sciences)  . Smokeless tobacco: Not on file  . Alcohol Use: No  . Drug Use: No  . Sexual Activity: Yes   Other Topics Concern  . Not on file   Social History Narrative  . No narrative on file    Review of Systems: Constitutional: Negative for fever, chills, diaphoresis, activity change, appetite change and fatigue. HENT: Negative for ear pain, nosebleeds, congestion, facial swelling, rhinorrhea, neck pain, neck stiffness and ear discharge.  Eyes: Negative for pain, discharge, redness, itching and visual disturbance. Respiratory: Negative for cough, choking, chest tightness, shortness of breath, wheezing and stridor.  Cardiovascular:  Negative for chest pain, palpitations and leg swelling. Gastrointestinal: Negative for abdominal distention. Genitourinary: Negative for dysuria, urgency, frequency, hematuria, flank pain, decreased urine volume, difficulty urinating and dyspareunia.  Musculoskeletal: Negative for back pain, joint swelling, arthralgia and gait problem. Neurological: Negative for dizziness, tremors, seizures, syncope, facial asymmetry, speech difficulty, weakness, light-headedness, numbness and headaches.  Hematological: Negative for adenopathy. Does not bruise/bleed easily. Psychiatric/Behavioral: Negative for hallucinations, behavioral problems, confusion, dysphoric mood, decreased concentration and agitation.    Objective:   Filed Vitals:   10/25/13 1144  BP: 127/81  Pulse: 76  Temp: 98.2 F (36.8 C)    Physical Exam: Constitutional: Patient appears well-developed and well-nourished. No distress. HENT: Normocephalic, atraumatic, External right and left ear normal. Oropharynx is clear and moist.  Eyes: Conjunctivae and EOM are normal. PERRLA, no scleral icterus. Neck: Normal ROM. Neck supple. No JVD. No tracheal deviation. No thyromegaly. CVS: RRR, S1/S2 +, no murmurs, no gallops, no carotid bruit.  Pulmonary: Effort and breath sounds normal, no stridor, rhonchi, wheezes, rales.  Abdominal: Soft. BS +, no distension, tenderness, rebound or guarding. groin no lumps testes non tender minimal tenderness on left epididymis   Musculoskeletal: Normal range of motion. No edema and no tenderness.  Neuro: Alert. Normal reflexes, muscle tone coordination. No cranial nerve deficit. Skin: Skin is warm and dry. No rash noted. Not diaphoretic. No erythema. No pallor. Psychiatric: Normal mood and affect. Behavior, judgment, thought content normal.  Lab Results  Component Value Date   WBC 5.3 01/10/2013   HGB  16.5 01/10/2013   HCT 46.5 01/10/2013   MCV 85.6 01/10/2013   PLT 265 01/10/2013   Lab Results    Component Value Date   CREATININE 0.76 01/10/2013   BUN 12 01/10/2013   NA 137 01/10/2013   K 4.2 01/10/2013   CL 103 01/10/2013   CO2 28 01/10/2013    No results found for this basename: HGBA1C   Lipid Panel  No results found for this basename: chol,  trig,  hdl,  cholhdl,  vldl,  ldlcalc       Assessment and plan:   1. Annual physical exam We'll do baseline blood work. - CBC with Differential - Lipid panel - COMPLETE METABOLIC PANEL WITH GFR - Vit D  25 hydroxy (rtn osteoporosis monitoring) - TSH  2. Dental cavities  - Ambulatory referral to Dentistry  3. Epididymitis  - doxycycline (VIBRA-TABS) 100 MG tablet; Take 1 tablet (100 mg total) by mouth 2 (two) times daily.  Dispense: 20 tablet; Refill: 0    Return in about 3 months (around 01/24/2014), or if symptoms worsen or fail to improve.   Lorayne Marek, MD

## 2013-10-25 NOTE — Progress Notes (Signed)
Patient is here today for a CPE. States that he still is have left side "discomfort" but not pain x 6-7 months, was seen in ED for this. 0/10 pain scale.

## 2013-10-26 ENCOUNTER — Telehealth: Payer: Self-pay | Admitting: Emergency Medicine

## 2013-10-26 LAB — VITAMIN D 25 HYDROXY (VIT D DEFICIENCY, FRACTURES): VIT D 25 HYDROXY: 34 ng/mL (ref 30–89)

## 2013-10-26 NOTE — Telephone Encounter (Signed)
Message copied by Ricci Barker on Fri Oct 26, 2013  2:13 PM ------      Message from: Lorayne Marek      Created: Fri Oct 26, 2013 10:41 AM       Call and let the Patient know that blood work is normal.       ------

## 2013-10-26 NOTE — Telephone Encounter (Signed)
Left message for pt to call for lab results 

## 2013-11-26 ENCOUNTER — Ambulatory Visit: Payer: Self-pay | Attending: Internal Medicine

## 2014-03-31 ENCOUNTER — Emergency Department (HOSPITAL_COMMUNITY): Payer: Self-pay

## 2014-03-31 ENCOUNTER — Encounter (HOSPITAL_COMMUNITY): Payer: Self-pay | Admitting: Physical Medicine and Rehabilitation

## 2014-03-31 ENCOUNTER — Emergency Department (HOSPITAL_COMMUNITY)
Admission: EM | Admit: 2014-03-31 | Discharge: 2014-03-31 | Disposition: A | Discharge status: Home / Self Care | Payer: Self-pay | Attending: Emergency Medicine | Admitting: Emergency Medicine

## 2014-03-31 DIAGNOSIS — J069 Acute upper respiratory infection, unspecified: Secondary | ICD-10-CM | POA: Insufficient documentation

## 2014-03-31 DIAGNOSIS — R05 Cough: Secondary | ICD-10-CM

## 2014-03-31 DIAGNOSIS — J209 Acute bronchitis, unspecified: Secondary | ICD-10-CM | POA: Insufficient documentation

## 2014-03-31 DIAGNOSIS — R0981 Nasal congestion: Secondary | ICD-10-CM | POA: Insufficient documentation

## 2014-03-31 DIAGNOSIS — R058 Other specified cough: Secondary | ICD-10-CM

## 2014-03-31 MED ORDER — BENZONATATE 100 MG PO CAPS: 100.0000 mg | ORAL_CAPSULE | Freq: Three times a day (TID) | ORAL | Status: DC

## 2014-03-31 MED ORDER — PREDNISONE 20 MG PO TABS
60.0000 mg | ORAL_TABLET | Freq: Once | ORAL | Status: AC
  Administered 2014-03-31: 60 mg via ORAL
  Filled 2014-03-31: qty 3

## 2014-03-31 MED ORDER — ALBUTEROL SULFATE HFA 108 (90 BASE) MCG/ACT IN AERS
2.0000 | INHALATION_SPRAY | RESPIRATORY_TRACT | Status: DC | PRN
  Administered 2014-03-31: 2 via RESPIRATORY_TRACT
  Filled 2014-03-31: qty 6.7

## 2014-03-31 MED ORDER — FLUTICASONE PROPIONATE 50 MCG/ACT NA SUSP
2.0000 | Freq: Every day | NASAL | Status: DC
  Administered 2014-03-31: 2 via NASAL
  Filled 2014-03-31: qty 16

## 2014-03-31 MED ORDER — ALBUTEROL SULFATE (2.5 MG/3ML) 0.083% IN NEBU
5.0000 mg | INHALATION_SOLUTION | Freq: Once | RESPIRATORY_TRACT | Status: AC
  Administered 2014-03-31: 5 mg via RESPIRATORY_TRACT
  Filled 2014-03-31: qty 6

## 2014-03-31 MED ORDER — AEROCHAMBER PLUS W/MASK MISC
1.0000 | Freq: Once | Status: AC
  Administered 2014-03-31: 1
  Filled 2014-03-31: qty 1

## 2014-03-31 NOTE — ED Notes (Signed)
Pt back from x-ray.

## 2014-03-31 NOTE — ED Notes (Signed)
Pt presents to department for evaluation of productive cough with green sputum, nasal congestion and runny nose. Ongoing x1 week. Respirations unlabored. NAD.

## 2014-03-31 NOTE — ED Notes (Signed)
Pt verbalized understanding of discharge/prescriptions and has no further questions.

## 2014-03-31 NOTE — ED Notes (Signed)
Pt taken to xray 

## 2014-03-31 NOTE — ED Provider Notes (Signed)
CSN: [id number]     Arrival date & time 03/31/14  1713 History   First MD Initiated Contact with Patient 03/31/14 1816     Chief Complaint  Patient presents with  . Cough  . Nasal Congestion     (Consider location/radiation/quality/duration/timing/severity/associated sxs/prior Treatment) Patient is a 39 y.o. male presenting with cough. The history is provided by the patient and medical records. No language interpreter was used.  Cough Associated symptoms: chills (subjective), rhinorrhea and sore throat   Associated symptoms: no chest pain, no ear pain, no fever, no headaches, no myalgias, no rash, no shortness of breath and no wheezing      Jurrell Fabio Bering is a 39 y.o. male  with a hx of no known medical problems presents to the Emergency Department complaining of gradual, persistent, progressively worsening rhinorrhea, sore throat, chest congestion, sinus congestion and cough onset 1 week ago. Patient reports that he had a dry hacking cough beginning approximately 2 months ago but symptoms worsened one week ago when his URI symptoms began. Patient reports no treatments prior to arrival. He denies recent travel, swelling of his legs, chest pain or shortness of breath. He reports that his cough is keeping him up at night. He also reports that his cough is productive of green sputum. He does endorse postnasal drip. He reports subjective chills yesterday but has had no measured fever at home. Nothing seems to make his symptoms better or worse.  He denies sick contacts. He denies headache, neck pain, neck stiffness, chest pain, shortness of breath, abdominal pain, nausea, vomiting, diarrhea, weakness, dizziness, syncope.     History reviewed. No pertinent past medical history. History reviewed. No pertinent past surgical history. History reviewed. No pertinent family history. History  Substance Use Topics  . Smoking status: Former Research scientist (life sciences)  . Smokeless tobacco: Not on file  . Alcohol Use: No     Review of Systems  Constitutional: Positive for chills (subjective). Negative for fever, appetite change and fatigue.  HENT: Positive for congestion, postnasal drip, rhinorrhea, sinus pressure and sore throat. Negative for ear discharge, ear pain and mouth sores.   Eyes: Negative for visual disturbance.  Respiratory: Positive for cough. Negative for chest tightness, shortness of breath, wheezing and stridor.   Cardiovascular: Negative for chest pain, palpitations and leg swelling.  Gastrointestinal: Negative for nausea, vomiting, abdominal pain and diarrhea.  Genitourinary: Negative for dysuria, urgency, frequency and hematuria.  Musculoskeletal: Negative for myalgias, back pain, arthralgias and neck stiffness.  Skin: Negative for rash.  Neurological: Negative for syncope, light-headedness, numbness and headaches.  Hematological: Negative for adenopathy.  Psychiatric/Behavioral: The patient is not nervous/anxious.   All other systems reviewed and are negative.     Allergies  Ibuprofen and Tamiflu  Home Medications   Prior to Admission medications   Medication Sig Start Date End Date Taking? Authorizing Provider  benzonatate (TESSALON) 100 MG capsule Take 1 capsule (100 mg total) by mouth every 8 (eight) hours. 03/31/14   Luvinia Lucy, PA-C  doxycycline (VIBRA-TABS) 100 MG tablet Take 1 tablet (100 mg total) by mouth 2 (two) times daily. 10/25/13   Deepak Advani, MD   BP 132/77 mmHg  Pulse 103  Temp(Src) 97.8 F (36.6 C) (Oral)  Resp 20  SpO2 100% Physical Exam  Constitutional: He is oriented to person, place, and time. He appears well-developed and well-nourished. No distress.  HENT:  Head: Normocephalic and atraumatic.  Right Ear: Tympanic membrane, external ear and ear canal normal.  Left Ear: Tympanic membrane,  external ear and ear canal normal.  Nose: Mucosal edema and rhinorrhea present. No epistaxis. Right sinus exhibits no maxillary sinus tenderness and no  frontal sinus tenderness. Left sinus exhibits no maxillary sinus tenderness and no frontal sinus tenderness.  Mouth/Throat: Uvula is midline, oropharynx is clear and moist and mucous membranes are normal. Mucous membranes are not pale and not cyanotic. No oropharyngeal exudate, posterior oropharyngeal edema, posterior oropharyngeal erythema or tonsillar abscesses.  Postnasal drip noted to the posterior oropharynx Rhinorrhea  Eyes: Conjunctivae are normal. Pupils are equal, round, and reactive to light.  Neck: Normal range of motion and full passive range of motion without pain.  Cardiovascular: Normal rate, normal heart sounds and intact distal pulses.   No murmur heard. Pulmonary/Chest: Effort normal and breath sounds normal. No stridor.  Diminished and coarse but equal breath sounds without focal wheezes or rales  Abdominal: Soft. Bowel sounds are normal. He exhibits no distension. There is no tenderness.  Musculoskeletal: Normal range of motion.  Lymphadenopathy:    He has no cervical adenopathy.  Neurological: He is alert and oriented to person, place, and time.  Skin: Skin is warm and dry. No rash noted. He is not diaphoretic.  Psychiatric: He has a normal mood and affect.  Nursing note and vitals reviewed.   ED Course  Procedures (including critical care time) Labs Review Labs Reviewed - No data to display  Imaging Review Dg Chest 2 View  03/31/2014   CLINICAL DATA:  Subacute productive cough and acute shortness of breath. Initial encounter.  EXAM: CHEST  2 VIEW  COMPARISON:  11/16/2012  FINDINGS: The cardiomediastinal silhouette is unremarkable.  Lungs are clear.  There is no evidence of focal airspace disease, pulmonary edema, suspicious pulmonary nodule/mass, pleural effusion, or pneumothorax. No acute bony abnormalities are identified.  IMPRESSION: No active cardiopulmonary disease.   Electronically Signed   By: Margarette Canada M.D.   On: 03/31/2014 17:49     EKG  Interpretation None      MDM   Final diagnoses:  URI (upper respiratory infection)  Productive cough  Bronchospasm with bronchitis, acute   Chauncy Lean presents with URI symptoms for approximately one week.  Patient is a nonsmoker. Pt CXR negative for acute infiltrate. Patients symptoms are consistent with URI, likely viral etiology. Discussed that antibiotics are not indicated for viral infections. Patient with diminished breath sounds. Will be given albuterol here in the emergency department.  6:53 PM Pt with improvement in breath sounds; no wheezes.  Pt will be discharged with symptomatic treatment.  Verbalizes understanding and is agreeable with plan. Pt is hemodynamically stable & in NAD prior to dc.    I have personally reviewed patient's vitals, nursing note and any pertinent labs or imaging.  I performed an focused physical exam; undressed when appropriate .    It has been determined that no acute conditions requiring further emergency intervention are present at this time. The patient/guardian have been advised of the diagnosis and plan. I reviewed any labs and imaging including any potential incidental findings. We have discussed signs and symptoms that warrant return to the ED and they are listed in the discharge instructions.    Vital signs are stable at discharge.   BP 132/77 mmHg  Pulse 103  Temp(Src) 97.8 F (36.6 C) (Oral)  Resp 20  SpO2 100%         Abigail Butts, PA-C 03/31/14 1911  Orlie Dakin, MD 04/01/14 [phone]

## 2014-03-31 NOTE — Discharge Instructions (Signed)
1. Medications: flonase, albuterol, mucinex, tessalon, usual home medications 2. Treatment: rest, drink plenty of fluids, take tylenol or ibuprofen for fever control 3. Follow Up: Please followup with your primary doctor in 3 days for discussion of your diagnoses and further evaluation after today's visit; if you do not have a primary care doctor use the resource guide provided to find one; Return to the ER for high fevers, difficulty breathing or other concerning symptoms    Cough, Adult  A cough is a reflex that helps clear your throat and airways. It can help heal the body or may be a reaction to an irritated airway. A cough may only last 2 or 3 weeks (acute) or may last more than 8 weeks (chronic).  CAUSES Acute cough:  Viral or bacterial infections. Chronic cough:  Infections.  Allergies.  Asthma.  Post-nasal drip.  Smoking.  Heartburn or acid reflux.  Some medicines.  Chronic lung problems (COPD).  Cancer. SYMPTOMS   Cough.  Fever.  Chest pain.  Increased breathing rate.  High-pitched whistling sound when breathing (wheezing).  Colored mucus that you cough up (sputum). TREATMENT   A bacterial cough may be treated with antibiotic medicine.  A viral cough must run its course and will not respond to antibiotics.  Your caregiver may recommend other treatments if you have a chronic cough. HOME CARE INSTRUCTIONS   Only take over-the-counter or prescription medicines for pain, discomfort, or fever as directed by your caregiver. Use cough suppressants only as directed by your caregiver.  Use a cold steam vaporizer or humidifier in your bedroom or home to help loosen secretions.  Sleep in a semi-upright position if your cough is worse at night.  Rest as needed.  Stop smoking if you smoke. SEEK IMMEDIATE MEDICAL CARE IF:   You have pus in your sputum.  Your cough starts to worsen.  You cannot control your cough with suppressants and are losing  sleep.  You begin coughing up blood.  You have difficulty breathing.  You develop pain which is getting worse or is uncontrolled with medicine.  You have a fever. MAKE SURE YOU:   Understand these instructions.  Will watch your condition.  Will get help right away if you are not doing well or get worse. Document Released: 07/31/2010 Document Revised: 04/26/2011 Document Reviewed: 07/31/2010 [facility] Patient Information 2015 [place], [place]. This information is not intended to replace advice given to you by your health care provider. Make sure you discuss any questions you have with your health care provider.

## 2014-05-27 ENCOUNTER — Encounter (HOSPITAL_COMMUNITY): Payer: Self-pay

## 2014-05-27 ENCOUNTER — Emergency Department (HOSPITAL_COMMUNITY)
Admission: EM | Admit: 2014-05-27 | Discharge: 2014-05-27 | Disposition: A | Discharge status: Home / Self Care | Payer: Self-pay | Attending: Emergency Medicine | Admitting: Emergency Medicine

## 2014-05-27 DIAGNOSIS — R109 Unspecified abdominal pain: Secondary | ICD-10-CM

## 2014-05-27 DIAGNOSIS — R1012 Left upper quadrant pain: Secondary | ICD-10-CM | POA: Insufficient documentation

## 2014-05-27 DIAGNOSIS — Z87891 Personal history of nicotine dependence: Secondary | ICD-10-CM | POA: Insufficient documentation

## 2014-05-27 DIAGNOSIS — G8929 Other chronic pain: Secondary | ICD-10-CM | POA: Insufficient documentation

## 2014-05-27 LAB — CBC WITH DIFFERENTIAL/PLATELET
BASOS ABS: 0 10*3/uL (ref 0.0–0.1)
Basophils Relative: 0 % (ref 0–1)
Eosinophils Absolute: 0.3 10*3/uL (ref 0.0–0.7)
Eosinophils Relative: 5 % (ref 0–5)
HCT: 43.8 % (ref 39.0–52.0)
Hemoglobin: 15.9 g/dL (ref 13.0–17.0)
LYMPHS ABS: 2 10*3/uL (ref 0.7–4.0)
LYMPHS PCT: 31 % (ref 12–46)
MCH: 30.9 pg (ref 26.0–34.0)
MCHC: 36.3 g/dL — ABNORMAL HIGH (ref 30.0–36.0)
MCV: 85 fL (ref 78.0–100.0)
MONO ABS: 0.5 10*3/uL (ref 0.1–1.0)
Monocytes Relative: 8 % (ref 3–12)
Neutro Abs: 3.6 10*3/uL (ref 1.7–7.7)
Neutrophils Relative %: 56 % (ref 43–77)
Platelets: 239 10*3/uL (ref 150–400)
RBC: 5.15 MIL/uL (ref 4.22–5.81)
RDW: 13.1 % (ref 11.5–15.5)
WBC: 6.4 10*3/uL (ref 4.0–10.5)

## 2014-05-27 LAB — URINALYSIS, ROUTINE W REFLEX MICROSCOPIC
BILIRUBIN URINE: NEGATIVE
Glucose, UA: NEGATIVE mg/dL
HGB URINE DIPSTICK: NEGATIVE
Ketones, ur: NEGATIVE mg/dL
Leukocytes, UA: NEGATIVE
NITRITE: NEGATIVE
Protein, ur: NEGATIVE mg/dL
SPECIFIC GRAVITY, URINE: 1.01 (ref 1.005–1.030)
Urobilinogen, UA: 0.2 mg/dL (ref 0.0–1.0)
pH: 5.5 (ref 5.0–8.0)

## 2014-05-27 LAB — COMPREHENSIVE METABOLIC PANEL
ALT: 50 U/L (ref 0–53)
AST: 37 U/L (ref 0–37)
Albumin: 3.8 g/dL (ref 3.5–5.2)
Alkaline Phosphatase: 97 U/L (ref 39–117)
Anion gap: 13 (ref 5–15)
BUN: 13 mg/dL (ref 6–23)
CO2: 20 mmol/L (ref 19–32)
Calcium: 8.6 mg/dL (ref 8.4–10.5)
Chloride: 105 mmol/L (ref 96–112)
Creatinine, Ser: 0.98 mg/dL (ref 0.50–1.35)
GLUCOSE: 119 mg/dL — AB (ref 70–99)
Potassium: 3.8 mmol/L (ref 3.5–5.1)
SODIUM: 138 mmol/L (ref 135–145)
Total Bilirubin: 0.7 mg/dL (ref 0.3–1.2)
Total Protein: 6.7 g/dL (ref 6.0–8.3)

## 2014-05-27 LAB — LIPASE, BLOOD: Lipase: 36 U/L (ref 11–59)

## 2014-05-27 MED ORDER — ACETAMINOPHEN 325 MG PO TABS
650.0000 mg | ORAL_TABLET | Freq: Once | ORAL | Status: AC
  Administered 2014-05-27: 650 mg via ORAL
  Filled 2014-05-27: qty 2

## 2014-05-27 MED ORDER — IOHEXOL 300 MG/ML  SOLN: 25.0000 mL | Freq: Once | INTRAMUSCULAR | Status: DC | PRN

## 2014-05-27 MED ORDER — PANTOPRAZOLE SODIUM 40 MG IV SOLR
40.0000 mg | Freq: Once | INTRAVENOUS | Status: AC
  Administered 2014-05-27: 40 mg via INTRAVENOUS
  Filled 2014-05-27: qty 40

## 2014-05-27 NOTE — Discharge Instructions (Signed)

## 2014-05-27 NOTE — ED Notes (Signed)
MD at bedside. 

## 2014-05-27 NOTE — ED Notes (Signed)
Pt. Reports left sided abdominal/side pain x months that is worse after eating with nausea and bloating. States pain used to come and go but is progressively becoming more steady. Reports increase in urination with mild pain. Denies bowel problems. Seen previously for the same with no specific results

## 2014-05-27 NOTE — ED Notes (Signed)
Pt stable, ambulatory, pain decreased to 3/10, states understanding of discharge instructions

## 2014-05-28 NOTE — ED Provider Notes (Signed)
CSN: [id number]     Arrival date & time 05/27/14  1951 History   First MD Initiated Contact with Patient 05/27/14 2134     Chief Complaint  Patient presents with  . Abdominal Pain     (Consider location/radiation/quality/duration/timing/severity/associated sxs/prior Treatment) HPI   This is a 39 yo male with no pertinent PMH, presenting today with abdominal pain.  This has been going on for months.  Located LUQ.  Intermittent.  Characterized as "something being there, not really a pain."  No medicines have been taken.  Pain is non-radiating.  Negative for nausea, vomiting, diarrhea, constipation, hematochezia, melena, dysuria, hematuria.    History reviewed. No pertinent past medical history. History reviewed. No pertinent past surgical history. No family history on file. History  Substance Use Topics  . Smoking status: Former Research scientist (life sciences)  . Smokeless tobacco: Not on file  . Alcohol Use: No    Review of Systems  Constitutional: Negative for fever and chills.  HENT: Negative for facial swelling.   Eyes: Negative for pain and visual disturbance.  Respiratory: Negative for chest tightness and shortness of breath.   Cardiovascular: Negative for chest pain.  Gastrointestinal: Negative for nausea and vomiting.  Genitourinary: Negative for dysuria.  Musculoskeletal: Negative for myalgias and arthralgias.  Neurological: Negative for headaches.  Psychiatric/Behavioral: Negative for behavioral problems.      Allergies  Ibuprofen and Tamiflu  Home Medications   Prior to Admission medications   Medication Sig Start Date End Date Taking? Authorizing Provider  benzonatate (TESSALON) 100 MG capsule Take 1 capsule (100 mg total) by mouth every 8 (eight) hours. Patient not taking: Reported on 05/27/2014 03/31/14   Jarrett Soho Muthersbaugh, PA-C  doxycycline (VIBRA-TABS) 100 MG tablet Take 1 tablet (100 mg total) by mouth 2 (two) times daily. Patient not taking: Reported on 05/27/2014 10/25/13    Lorayne Marek, MD   BP 139/93 mmHg  Pulse 66  Temp(Src) 98 F (36.7 C) (Oral)  Resp 20  Ht 5\' 5"  (1.651 m)  Wt 180 lb (81.647 kg)  BMI 29.95 kg/m2  SpO2 100% Physical Exam  Constitutional: He is oriented to person, place, and time. He appears well-developed and well-nourished. No distress.  HENT:  Head: Normocephalic and atraumatic.  Mouth/Throat: No oropharyngeal exudate.  Eyes: Conjunctivae are normal. Pupils are equal, round, and reactive to light. No scleral icterus.  Neck: Normal range of motion. No tracheal deviation present. No thyromegaly present.  Cardiovascular: Normal rate, regular rhythm and normal heart sounds.  Exam reveals no gallop and no friction rub.   No murmur heard. Pulmonary/Chest: Effort normal and breath sounds normal. No stridor. No respiratory distress. He has no wheezes. He has no rales. He exhibits no tenderness.  Abdominal: Soft. He exhibits no distension and no mass. There is no tenderness. There is no rebound and no guarding.  Musculoskeletal: Normal range of motion. He exhibits no edema.  Neurological: He is alert and oriented to person, place, and time.  Skin: Skin is warm and dry. He is not diaphoretic.    ED Course  Procedures (including critical care time) Labs Review Labs Reviewed  CBC WITH DIFFERENTIAL/PLATELET - Abnormal; Notable for the following:    MCHC 36.3 (*)    All other components within normal limits  COMPREHENSIVE METABOLIC PANEL - Abnormal; Notable for the following:    Glucose, Bld 119 (*)    All other components within normal limits  LIPASE, BLOOD  URINALYSIS, ROUTINE W REFLEX MICROSCOPIC    MDM   Final  diagnoses:  Chronic abdominal pain    This is a 39 yo male with no pertinent PMH, presenting today with abdominal pain.  This has been going on for months.  Located LUQ.  Intermittent.  Characterized as "something being there, not really a pain."  No medicines have been taken.  Pain feels better when he palpates it.   Pain is non-radiating.  Negative for nausea, vomiting, diarrhea, constipation, hematochezia, melena, dysuria, hematuria.    On exam, vitals are WNL.  CV, resp exams are WNL.  Abdominal exam has no TTP, no rebound, rigidity, or guarding.  CMP, CBC, lipase are all WNL, as is UA.  I do not believe this presentation represents CV or pulm etiology nor do I believe it represents obstruction, perforation, ischemia, or any emergent inflammation in the abdomen.  Pt stable for discharge, FU with GI.  All questions answered.  Return precautions given.  I have discussed case and care has been guided by my attending physician, Dr. Christy Gentles.  Doy Hutching, MD 05/28/14 3710  Ripley Fraise, MD 05/28/14 1125

## 2014-09-26 ENCOUNTER — Encounter (HOSPITAL_COMMUNITY): Payer: Self-pay | Admitting: Emergency Medicine

## 2014-09-26 ENCOUNTER — Emergency Department (HOSPITAL_COMMUNITY)
Admission: EM | Admit: 2014-09-26 | Discharge: 2014-09-27 | Disposition: A | Discharge status: Home / Self Care | Payer: Worker's Compensation | Attending: Emergency Medicine | Admitting: Emergency Medicine

## 2014-09-26 DIAGNOSIS — H10211 Acute toxic conjunctivitis, right eye: Secondary | ICD-10-CM

## 2014-09-26 DIAGNOSIS — Y9389 Activity, other specified: Secondary | ICD-10-CM | POA: Insufficient documentation

## 2014-09-26 DIAGNOSIS — X58XXXA Exposure to other specified factors, initial encounter: Secondary | ICD-10-CM | POA: Insufficient documentation

## 2014-09-26 DIAGNOSIS — Y990 Civilian activity done for income or pay: Secondary | ICD-10-CM | POA: Insufficient documentation

## 2014-09-26 DIAGNOSIS — Z87891 Personal history of nicotine dependence: Secondary | ICD-10-CM | POA: Insufficient documentation

## 2014-09-26 DIAGNOSIS — T543X4A Toxic effect of corrosive alkalis and alkali-like substances, undetermined, initial encounter: Secondary | ICD-10-CM | POA: Insufficient documentation

## 2014-09-26 DIAGNOSIS — S0591XA Unspecified injury of right eye and orbit, initial encounter: Secondary | ICD-10-CM | POA: Diagnosis present

## 2014-09-26 DIAGNOSIS — Y9289 Other specified places as the place of occurrence of the external cause: Secondary | ICD-10-CM | POA: Insufficient documentation

## 2014-09-26 MED ORDER — TETRACAINE HCL 0.5 % OP SOLN
1.0000 [drp] | Freq: Once | OPHTHALMIC | Status: AC
  Administered 2014-09-27: 1 [drp] via OPHTHALMIC
  Filled 2014-09-26: qty 2

## 2014-09-26 NOTE — ED Provider Notes (Signed)
CSN: [id number]      Arrival date & time 09/26/14  2311 History  This chart was scribed for [facility], NP, working with Roxanne Mins, MD by Starleen Arms, ED Scribe. This patient was seen in room TR04C/TR04C and the patient's care was started at 11:38 PM.   Chief Complaint  Patient presents with  . Eye Injury   The history is provided by the patient. No language interpreter was used.   HPI Comments: Darren Burns is a 39 y.o. male who presents to the Emergency Department complaining of burning right eye pain and redness onset this evening.  The patient reports he was at work earlier and went to put chlorine into the pool.  When he opened the chlorine container, he developed an abrupt sensation of a foreign body in the right eye.  He flushed the complaint with water but the sensation persisted and is currently present.     History reviewed. No pertinent past medical history. History reviewed. No pertinent past surgical history. No family history on file. Social History  Substance Use Topics  . Smoking status: Former Research scientist (life sciences)  . Smokeless tobacco: None  . Alcohol Use: No    Review of Systems  Eyes: Positive for pain and redness.  All other systems reviewed and are negative.     Allergies  Ibuprofen and Tamiflu  Home Medications   Prior to Admission medications   Medication Sig Start Date End Date Taking? Authorizing Provider  benzonatate (TESSALON) 100 MG capsule Take 1 capsule (100 mg total) by mouth every 8 (eight) hours. Patient not taking: Reported on 05/27/2014 03/31/14   Jarrett Soho Muthersbaugh, PA-C  doxycycline (VIBRA-TABS) 100 MG tablet Take 1 tablet (100 mg total) by mouth 2 (two) times daily. Patient not taking: Reported on 05/27/2014 10/25/13   Lorayne Marek, MD   BP 142/94 mmHg  Pulse 76  Temp(Src) 98.2 F (36.8 C)  Resp 20  Ht 5\' 5"  (1.651 m)  Wt 178 lb (80.74 kg)  BMI 29.62 kg/m2  SpO2 97% Physical Exam  Constitutional: He is oriented to person, place, and time.  He appears well-developed and well-nourished. No distress.  HENT:  Head: Normocephalic and atraumatic.  Eyes: EOM are normal. Pupils are equal, round, and reactive to light. Lids are everted and swept, no foreign bodies found. Right eye exhibits no discharge and no hordeolum. No foreign body present in the right eye. Right conjunctiva is injected.  Slit lamp exam:      The right eye shows no corneal abrasion, no corneal ulcer, no foreign body and no fluorescein uptake.  Slit lamp exam by Dr. Roxanne Mins  Neck: Neck supple. No tracheal deviation present.  Cardiovascular: Normal rate.   Pulmonary/Chest: Effort normal. No respiratory distress.  Musculoskeletal: Normal range of motion.  Neurological: He is alert and oriented to person, place, and time.  Skin: Skin is warm and dry.  Psychiatric: He has a normal mood and affect. His behavior is normal.  Nursing note and vitals reviewed.   ED Course  Procedures (including critical care time) Visual acuity, tetracaine, irrigated with NSS, slit lamp exam  DIAGNOSTIC STUDIES: Oxygen Saturation is 97% on RA, normal by my interpretation.    COORDINATION OF CARE:  11:44 PM Discussed treatment plan with patient at bedside.  Patient acknowledges and agrees with plan.   Tobramycin Opth drops and follow up with Dr. Valetta Close tomorrow. Will no give NSAID opth drops due to patient severe allergy to ibuprofen.  Hydrocodone 5/325 mg given prior to  d/c.   MDM  39 y.o. 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 Stable for d/c to follow up with opthalmology tomorrow. Discussed with the patient and all questioned fully answered. He agrees with plan.     Final diagnoses:  Chemical conjunctivitis of right eye    I personally performed the services described in this documentation, which was scribed in my presence. The recorded information has been reviewed and is accurate.    [address] [place], [place] 92/11/94  1740  Delora Fuel, MD 81/44/81 [id number]

## 2014-09-26 NOTE — ED Notes (Signed)
Pt. reports accidental swimming pool chemical ( Chlorine ) splattered at right eye while at work this evening with pain and redness.

## 2014-09-27 MED ORDER — FLUORESCEIN SODIUM 1 MG OP STRP
1.0000 | ORAL_STRIP | Freq: Once | OPHTHALMIC | Status: AC
  Administered 2014-09-27: 1 via OPHTHALMIC

## 2014-09-27 MED ORDER — TOBRAMYCIN 0.3 % OP SOLN
2.0000 [drp] | OPHTHALMIC | Status: DC
  Administered 2014-09-27: 2 [drp] via OPHTHALMIC
  Filled 2014-09-27: qty 5

## 2014-09-27 MED ORDER — HYDROCODONE-ACETAMINOPHEN 5-325 MG PO TABS
1.0000 | ORAL_TABLET | Freq: Once | ORAL | Status: AC
  Administered 2014-09-27: 1 via ORAL
  Filled 2014-09-27: qty 1

## 2014-09-27 NOTE — Discharge Instructions (Signed)
Call Dr. Romeo Apple office in the morning and tell them you need to be seen in the office for follow up tomorrow due to the eye injury.   Chemical Conjunctivitis Chemical conjunctivitis is an irritation of the underside of the eyelid and the white part of the eye. Conjunctivitis can be caused by infection, allergy or chemical irritation. In your case it has been caused by a chemical irritation of the eye. Symptoms almost always include: tearing, light sensitivity, gritty feeling (sensation) in the eyes, swelling of your eyelids, and often severe pain. In spite of the severe pain, this irritation will run its course and will improve within 24 hours.  HOME CARE INSTRUCTIONS   To ease discomfort apply a cool, clean wash cloth to your eye for 10 to 20 minutes, 3 to 4 times per day.  Do not rub your eyes.  Gently wipe away any discharge from the eyes with moistened tissues.  Wash your hands often with soap and use paper towels to dry.  Sunglasses may be helpful if light bothers your eyes.  Do not use eye make-up.  Do not use contact lenses until the irritation is gone.  Do not operate machinery or drive if your vision is blurred.  Take medications as directed by your caregiver. Artificial tears may ease discomfort.  Avoid the chemical or surroundings which caused the problem. Always use eye protection as necessary. SEEK MEDICAL CARE IF:   The eye is still pink (inflamed) 3 days after beginning treatment.  Pain in the eye increases.  You have discharge coming from either eye.  Your eyelids are stuck together in the morning.  You have an increased sensitivity to light.  An oral temperature above 102 F (38.9 C) develops.  You develop facial pain.  You have any problems that may be related to the medicine you are taking. SEEK IMMEDIATE MEDICAL CARE IF:   Your vision is getting worse.  You develop severe eye pain. MAKE SURE YOU:   Understand these instructions.  Will watch  your condition.  Will get help right away if you are not doing well or get worse. Document Released: 11/11/2004 Document Revised: 04/26/2011 Document Reviewed: 09/20/2007 [facility] Patient Information 2015 [place], [place]. This information is not intended to replace advice given to you by your health care provider. Make sure you discuss any questions you have with your health care provider.  Eye Drops Use eye drops as directed. It may be easier to have someone help you put the drops in your eye. If you are alone, use the following instructions to help you.  Wash your hands before putting drops in your eyes.  Read the label and look at your medication. Check for any expiration date that may appear on the bottle or tube. Changes of color may be a warning that the medication is old or ineffective. This is especially true if the medication has become brown in color. If you have questions or concerns, call your caregiver. DROPS  Tilt your head back with the affected eye uppermost. Gently pull down on your lower lid. Do not pull up on the upper lid.  Look up. Place the dropper or bottle just over the edge of the lower lid near the white portion at the bottom of the eye. The goal is to have the drop go into the little sac formed by the lower lid and the bottom of the eye itself. Do not release the drop from a height of several inches over the eye.  That will only serve to startle the person receiving the medicine when it lands and forces a blink.  Steady your hand in a comfortable manner. An example would be to hold the dropper or bottle between your thumb and index (pointing) finger. Lean your index finger against the brow.  Then, slowly and gently squeeze one drop of medication into your eye.  Once the medication has been applied, place your finger between the lower eyelid and the nose, pressing firmly against the nose for 5-10 seconds. This will slow the process of the eye drop entering the small  canal that normally drains tears into the nose, and therefore increases the exposure of the medicine to the eye for a few extra seconds. OINTMENTS  Look up. Place the tip of the tube just over the edge of the lower lid near the white portion at the bottom of the eye. The goal is to create a line of ointment along the inner surface of the eyelid in the little sac formed by the lower lid and the bottom of the eye itself.  Avoid touching the tube tip to your eyeball or eyelid. This avoids contamination of the tube or the medicine in the tube.  Once a line of medicine has been created, hold the upper lid up and look down before releasing the upper lid. This will force the ointment to spread over the surface of the eye.  Your vision will be very blurry for a few minutes after applying an ointment properly. This is normal and will clear as you continue to blink. For this reason, it is best to apply ointments just before going to sleep, or at a time when you can rest your eyes for 5-10 minutes after applying the medication. GENERAL  Store your medicine in a cool, dry place after each use.  If you need a second medication, wait at least two minutes. This helps the first medication to be taken up (absorbed) by the eye.  If you have been instructed to use both an eye drop and an eye ointment, always apply the drop first and then the ointment 3-4 minutes afterward. Never put medications into the eye unless the label reads, "For Ophthalmic Use," "For Use In Eyes" or "Eye Drops." If you have questions, call your caregiver. Document Released: 05/10/2000 Document Revised: 06/18/2013 Document Reviewed: 07/16/2008 [facility] Patient Information 2015 [place], [place]. This information is not intended to replace advice given to you by your health care provider. Make sure you discuss any questions you have with your health care provider.

## 2014-11-09 ENCOUNTER — Encounter (HOSPITAL_COMMUNITY): Payer: Self-pay | Admitting: Emergency Medicine

## 2014-11-09 ENCOUNTER — Emergency Department (HOSPITAL_COMMUNITY): Payer: Self-pay

## 2014-11-09 DIAGNOSIS — Z87891 Personal history of nicotine dependence: Secondary | ICD-10-CM | POA: Insufficient documentation

## 2014-11-09 DIAGNOSIS — R109 Unspecified abdominal pain: Secondary | ICD-10-CM | POA: Insufficient documentation

## 2014-11-09 DIAGNOSIS — R079 Chest pain, unspecified: Principal | ICD-10-CM | POA: Insufficient documentation

## 2014-11-09 DIAGNOSIS — G8929 Other chronic pain: Secondary | ICD-10-CM | POA: Insufficient documentation

## 2014-11-09 DIAGNOSIS — R0602 Shortness of breath: Secondary | ICD-10-CM | POA: Insufficient documentation

## 2014-11-09 LAB — CBC
HCT: 43.9 % (ref 39.0–52.0)
HEMOGLOBIN: 15.7 g/dL (ref 13.0–17.0)
MCH: 30.8 pg (ref 26.0–34.0)
MCHC: 35.8 g/dL (ref 30.0–36.0)
MCV: 86.2 fL (ref 78.0–100.0)
Platelets: 246 10*3/uL (ref 150–400)
RBC: 5.09 MIL/uL (ref 4.22–5.81)
RDW: 13.1 % (ref 11.5–15.5)
WBC: 7.4 10*3/uL (ref 4.0–10.5)

## 2014-11-09 LAB — BASIC METABOLIC PANEL
ANION GAP: 7 (ref 5–15)
BUN: 14 mg/dL (ref 6–20)
CALCIUM: 9.3 mg/dL (ref 8.9–10.3)
CO2: 27 mmol/L (ref 22–32)
Chloride: 105 mmol/L (ref 101–111)
Creatinine, Ser: 0.99 mg/dL (ref 0.61–1.24)
GFR calc Af Amer: >60 mL/min (ref >60)
Glucose, Bld: 94 mg/dL (ref 65–99)
POTASSIUM: 4.6 mmol/L (ref 3.5–5.1)
SODIUM: 139 mmol/L (ref 135–145)

## 2014-11-09 LAB — I-STAT TROPONIN, ED: Troponin i, poc: 0 ng/mL (ref 0.00–0.08)

## 2014-11-09 NOTE — ED Notes (Signed)
Patient with chest pain and shortness of breath that started this week.  Patient states that he was feeling dizzy and feeling like he was going to pass out today at work.  He states that he was sweating with the chest pain and shortness of breath.

## 2014-11-10 ENCOUNTER — Observation Stay (HOSPITAL_BASED_OUTPATIENT_CLINIC_OR_DEPARTMENT_OTHER): Payer: Self-pay

## 2014-11-10 ENCOUNTER — Observation Stay (HOSPITAL_COMMUNITY)
Admission: EM | Admit: 2014-11-10 | Discharge: 2014-11-10 | Disposition: A | Discharge status: Home / Self Care | Payer: Self-pay | Attending: Internal Medicine | Admitting: Internal Medicine

## 2014-11-10 DIAGNOSIS — R079 Chest pain, unspecified: Secondary | ICD-10-CM | POA: Diagnosis present

## 2014-11-10 LAB — LIPID PANEL
CHOL/HDL RATIO: 4.3 ratio
CHOLESTEROL: 146 mg/dL (ref 0–200)
HDL: 34 mg/dL — ABNORMAL LOW (ref >40)
LDL Cholesterol: 91 mg/dL (ref 0–99)
Triglycerides: 105 mg/dL (ref <150)
VLDL: 21 mg/dL (ref 0–40)

## 2014-11-10 LAB — D-DIMER, QUANTITATIVE: D-Dimer, Quant: 0.34 ug/mL-FEU (ref 0.00–0.48)

## 2014-11-10 LAB — TROPONIN I

## 2014-11-10 MED ORDER — NITROGLYCERIN 0.4 MG SL SUBL: 0.4000 mg | SUBLINGUAL_TABLET | SUBLINGUAL | Status: DC | PRN

## 2014-11-10 MED ORDER — UNABLE TO FIND: Status: DC

## 2014-11-10 MED ORDER — NITROGLYCERIN 2 % TD OINT: 0.5000 [in_us] | TOPICAL_OINTMENT | Freq: Four times a day (QID) | TRANSDERMAL | Status: DC

## 2014-11-10 MED ORDER — ACETAMINOPHEN 325 MG PO TABS: 650.0000 mg | ORAL_TABLET | Freq: Four times a day (QID) | ORAL | Status: DC | PRN

## 2014-11-10 MED ORDER — ACETAMINOPHEN 325 MG PO TABS
650.0000 mg | ORAL_TABLET | Freq: Four times a day (QID) | ORAL | Status: DC | PRN
  Administered 2014-11-10: 650 mg via ORAL
  Filled 2014-11-10: qty 2

## 2014-11-10 NOTE — H&P (Addendum)
[facility] Admission History and Physical       Darren Burns [id number] DOB: 03/06/1975 DOA: 11/10/2014  Referring physician: EDP PCP: Darren Marek, MD  Specialists:   Chief Complaint: Chest Pain  HPI: Darren Burns is a 40 y.o. male previously Healthy and no known health problems who presents to the ED with complaints of 5/10 Chest pain described as pressure-like associated with SOB and diaphoresis lasting for.  The pain today was more severe, and occurred while he was at work.   He denies that the pain is associated with exertion.   He reports having intermittent pain but not as bad for the past week.   He was evaluated in the ED, and his initial workup has been negative and he was referred for observation and further workup.     Review of Systems:  Constitutional: No Weight Loss, No Weight Gain, Night Sweats, Fevers, Chills, Dizziness, Light Headedness, Fatigue, or Generalized Weakness HEENT: No Headaches, Difficulty Swallowing,Tooth/Dental Problems,Sore Throat,  No Sneezing, Rhinitis, Ear Ache, Nasal Congestion, or Post Nasal Drip,  Cardio-vascular:   +Chest pain, Orthopnea, PND, Edema in Lower Extremities, Anasarca, Dizziness, Palpitations  Resp:  +Dyspnea, No DOE, No Productive Cough, No Non-Productive Cough, No Hemoptysis, No Wheezing.    GI: No Heartburn, Indigestion, Abdominal Pain, Nausea, Vomiting, Diarrhea, Constipation, Hematemesis, Hematochezia, Melena, Change in Bowel Habits,  Loss of Appetite  GU: No Dysuria, No Change in Color of Urine, No Urgency or Urinary Frequency, No Flank pain.  Musculoskeletal: No Joint Pain or Swelling, No Decreased Range of Motion, No Back Pain.  Neurologic: No Syncope, No Seizures, Muscle Weakness, Paresthesia, Vision Disturbance or Loss, No Diplopia, No Vertigo, No Difficulty Walking,  Skin: No Rash or Lesions. Psych: No Change in Mood or Affect, No Depression or Anxiety, No Memory loss, No Confusion, or  Hallucinations   History reviewed. No pertinent past medical history.   No past surgical history on file.    Prior to Admission medications   Medication Sig Start Date End Date Taking? Authorizing Provider  benzonatate (TESSALON) 100 MG capsule Take 1 capsule (100 mg total) by mouth every 8 (eight) hours. Patient not taking: Reported on 05/27/2014 03/31/14   Darren Soho Muthersbaugh, PA-C  doxycycline (VIBRA-TABS) 100 MG tablet Take 1 tablet (100 mg total) by mouth 2 (two) times daily. Patient not taking: Reported on 05/27/2014 10/25/13   Darren Marek, MD     Allergies  Allergen Reactions  . Ibuprofen Other (See Comments)    intolerance  . Tamiflu [Oseltamivir Phosphate] Nausea And Vomiting     Social History:  reports that he has quit smoking. He does not have any smokeless tobacco history on file. He reports that he does not drink alcohol or use illicit drugs.      Family History:     HTN in Mother   Physical Exam:  GEN:  Pleasant Well Nourished and Well Developed 39 y.o.  male examined and in no acute distress; cooperative with exam Filed Vitals:   11/10/14 0145 11/10/14 0200 11/10/14 0215 11/10/14 0245  BP: 140/93 136/98 133/95 128/101  Pulse: 62 58 62 63  Temp:      TempSrc:      Resp: 15 14 17 19   Height:      Weight:      SpO2: 100% 100% 100% 100%   Blood pressure 128/101, pulse 63, temperature 97.8 F (36.6 C), temperature source Oral, resp. rate 19, height 5\' 6"  (1.676 m), weight 80.74 kg (178 lb), SpO2 100 %.  PSYCH: He is alert and oriented x4; does not appear anxious does not appear depressed; affect is normal HEENT: Normocephalic and Atraumatic, Mucous membranes pink; PERRLA; EOM intact; Fundi:  Benign;  No scleral icterus, Nares: Patent, Oropharynx: Clear,  Fair Dentition,    Neck:  FROM, No Cervical Lymphadenopathy nor Thyromegaly or Carotid Bruit; No JVD; Breasts:: Not examined CHEST WALL: No tenderness CHEST: Normal respiration, clear to auscultation  bilaterally HEART: Regular rate and rhythm; no murmurs rubs or gallops BACK: No kyphosis or scoliosis; No CVA tenderness ABDOMEN: Positive Bowel Sounds,  Soft Non-Tender, No Rebound or Guarding; No Masses, No Organomegaly Rectal Exam: Not done EXTREMITIES: No Cyanosis, Clubbing, or Edema; No Ulcerations. Genitalia: not examined PULSES: 2+ and symmetric SKIN: Normal hydration no rash or ulceration CNS:  Alert and Oriented x 4, No Focal Deficits Vascular: pulses palpable throughout    Labs on Admission:  Basic Metabolic Panel:  Recent Labs Lab 11/09/14 2045  NA 139  K 4.6  CL 105  CO2 27  GLUCOSE 94  BUN 14  CREATININE 0.99  CALCIUM 9.3   Liver Function Tests: No results for input(s): AST, ALT, ALKPHOS, BILITOT, PROT, ALBUMIN in the last 168 hours. No results for input(s): LIPASE, AMYLASE in the last 168 hours. No results for input(s): AMMONIA in the last 168 hours. CBC:  Recent Labs Lab 11/09/14 2045  WBC 7.4  HGB 15.7  HCT 43.9  MCV 86.2  PLT 246   Cardiac Enzymes: No results for input(s): CKTOTAL, CKMB, CKMBINDEX, TROPONINI in the last 168 hours.  BNP (last 3 results) No results for input(s): BNP in the last 8760 hours.  ProBNP (last 3 results) No results for input(s): PROBNP in the last 8760 hours.  CBG: No results for input(s): GLUCAP in the last 168 hours.  Radiological Exams on Admission: Dg Chest 2 View  11/09/2014   CLINICAL DATA:  Left-sided chest pain  EXAM: CHEST - 2 VIEW  COMPARISON:  None.  FINDINGS: The heart size and mediastinal contours are within normal limits. Both lungs are clear. The visualized skeletal structures are unremarkable.  IMPRESSION: No active disease.   Electronically Signed   By: Inez Catalina M.D.   On: 11/09/2014 21:22     EKG: Independently reviewed. Normal Sinus Rhythm, Rate = 72, No Acute S-T Changes     Assessment/Plan:   39 y.o. male with  Principal Problem:   1.    Chest pain   Cardiac monitoring   Cycle  Troponins   Nitropaste, O2, No ASA (due to Cross Reactivity with Ibuprofen which causes Angioedema)   2D ECHO in AM   Check Fasting Lipids   Check D-dimer     2.    DVT Prophylaxis   Lovenox    Code Status:     FULL CODE       Family Communication:   No Family Present    Disposition Plan:   Observation Status        Time spent:  63 Minutes      Theressa Millard [facility] Pager [phone]   If [place] Please Contact the Day Rounding Team MD for [facility]  If 7PM-7AM, Please Contact Night-Floor Coverage  [email or web address] Password [email or web address] 11/10/2014, 3:14 AM     ADDENDUM:   Patient was seen and examined on 11/10/2014

## 2014-11-10 NOTE — Discharge Summary (Signed)
Physician Discharge Summary  Darren Burns [id number] DOB: [birth year] DOA: 11/10/2014  PCP: Lorayne Marek, MD  Admit date: 11/10/2014 Discharge date: 11/10/2014  Time spent: 45 minutes  Recommendations for Outpatient Follow-up:  Dr.Advani in 1 week, needs referral for GI in 1 month  Discharge Diagnoses:  Principal Problem:   Chest pain   Chronic Abdominal pain  Discharge Condition: stable  Diet recommendation: heart healthy  Filed Weights   11/09/14 2035 11/10/14 0359  Weight: 80.74 kg (178 lb) 82.6 kg (182 lb 1.6 oz)    History of present illness:  Chief Complaint: Chest Pain  HPI: Darren Burns is a 39 y.o. male previously Healthy and no known health problems who presented to the ED with complaints of 5/10 Lower Chest/Abdominal pain described as pressure-like, that occurred at work today  Hospital Course:   Atypical chest/Lower abd pain -resolved, EKG and cardiac enzymes WNL -no cardiac risk factors at all -ECHO with normal EF and wall motion -he's had intermittent LUQ Abd pain, for a while, CT ABd > 1 year ago was normal, advised to FU with GI  Discharge Exam: Filed Vitals:   11/10/14 1438  BP: 133/80  Pulse: 69  Temp: 98.8 F (37.1 C)  Resp: 17    General: AAOx3 Cardiovascular: S1S2/RRR Respiratory: CTAB  Discharge Instructions   Discharge Instructions    Activity as tolerated - No restrictions    Complete by:  As directed      Diet - low sodium heart healthy    Complete by:  As directed           Current Discharge Medication List    START taking these medications   Details  acetaminophen (TYLENOL) 325 MG tablet Take 2 tablets (650 mg total) by mouth every 6 (six) hours as needed for mild pain or headache.      STOP taking these medications     benzonatate (TESSALON) 100 MG capsule      doxycycline (VIBRA-TABS) 100 MG tablet        Allergies  Allergen Reactions  . Ibuprofen Swelling and Other (See Comments)   intolerance  . Tamiflu [Oseltamivir Phosphate] Nausea And Vomiting   Follow-up Information    Follow up with Gastroenterologist In 1 month.       The results of significant diagnostics from this hospitalization (including imaging, microbiology, ancillary and laboratory) are listed below for reference.    Significant Diagnostic Studies: Dg Chest 2 View  11/09/2014   CLINICAL DATA:  Left-sided chest pain  EXAM: CHEST - 2 VIEW  COMPARISON:  None.  FINDINGS: The heart size and mediastinal contours are within normal limits. Both lungs are clear. The visualized skeletal structures are unremarkable.  IMPRESSION: No active disease.   Electronically Signed   By: Inez Catalina M.D.   On: 11/09/2014 21:22    Microbiology: No results found for this or any previous visit (from the past 240 hour(s)).   Labs: Basic Metabolic Panel:  Recent Labs Lab 11/09/14 2045  NA 139  K 4.6  CL 105  CO2 27  GLUCOSE 94  BUN 14  CREATININE 0.99  CALCIUM 9.3   Liver Function Tests: No results for input(s): AST, ALT, ALKPHOS, BILITOT, PROT, ALBUMIN in the last 168 hours. No results for input(s): LIPASE, AMYLASE in the last 168 hours. No results for input(s): AMMONIA in the last 168 hours. CBC:  Recent Labs Lab 11/09/14 2045  WBC 7.4  HGB 15.7  HCT 43.9  MCV 86.2  PLT 246  Cardiac Enzymes:  Recent Labs Lab 11/10/14 0910  TROPONINI <0.03   BNP: BNP (last 3 results) No results for input(s): BNP in the last 8760 hours.  ProBNP (last 3 results) No results for input(s): PROBNP in the last 8760 hours.  CBG: No results for input(s): GLUCAP in the last 168 hours.     SignedDomenic Polite  [facility] 11/10/2014, 2:44 PM

## 2014-11-10 NOTE — Progress Notes (Signed)
  Echocardiogram 2D Echocardiogram has been performed.  Donata Clay 11/10/2014, 9:53 AM

## 2014-11-10 NOTE — ED Provider Notes (Signed)
CSN: [id number]     Arrival date & time 11/09/14  2028 History  This chart was scribed for Everlene Balls, MD by Evelene Croon, ED Scribe. This patient was seen in room B18C/B18C and the patient's care was started 12:52 AM.  Chief Complaint  Patient presents with  . Chest Pain  . Shortness of Breath   The history is provided by the patient. No language interpreter was used.     HPI Comments:  Darren Burns is a 39 y.o. male who presents to the Emergency Department complaining of moderate central CP that started yesterday while at work. Pt works for [employer]. He describes his pain as a pressure. Pt reports associated LUE pain, brief weakness in his BLE, facial numbness, diaphoresis, nausea and cough. He reports similar episode a few months ago; was not evaluated at the time. He denies vomiting. He also denies h/o blood clots and use of blood thinners. No alleviating factors noted.  History reviewed. No pertinent past medical history. No past surgical history on file. No family history on file. Social History  Substance Use Topics  . Smoking status: Former Research scientist (life sciences)  . Smokeless tobacco: None  . Alcohol Use: No    Review of Systems A complete 10 system review of systems was obtained and all systems are negative except as noted in the HPI and PMH.   Allergies  Ibuprofen and Tamiflu  Home Medications   Prior to Admission medications   Medication Sig Start Date End Date Taking? Authorizing Provider  benzonatate (TESSALON) 100 MG capsule Take 1 capsule (100 mg total) by mouth every 8 (eight) hours. Patient not taking: Reported on 05/27/2014 03/31/14   Jarrett Soho Muthersbaugh, PA-C  doxycycline (VIBRA-TABS) 100 MG tablet Take 1 tablet (100 mg total) by mouth 2 (two) times daily. Patient not taking: Reported on 05/27/2014 10/25/13   Lorayne Marek, MD   BP 138/94 mmHg  Pulse 72  Temp(Src) 97.8 F (36.6 C) (Oral)  Resp 12  Ht 5\' 6"  (1.676 m)  Wt 178 lb (80.74 kg)  BMI 28.74 kg/m2   SpO2 100% Physical Exam  Constitutional: He is oriented to person, place, and time. Vital signs are normal. He appears well-developed and well-nourished.  Non-toxic appearance. He does not appear ill. No distress.  HENT:  Head: Normocephalic and atraumatic.  Nose: Nose normal.  Mouth/Throat: Oropharynx is clear and moist. No oropharyngeal exudate.  Eyes: Conjunctivae and EOM are normal. Pupils are equal, round, and reactive to light. No scleral icterus.  Neck: Normal range of motion. Neck supple. No tracheal deviation, no edema, no erythema and normal range of motion present. No thyroid mass and no thyromegaly present.  Cardiovascular: Normal rate, regular rhythm, S1 normal, S2 normal, normal heart sounds, intact distal pulses and normal pulses.  Exam reveals no gallop and no friction rub.   No murmur heard. Pulses:      Radial pulses are 2+ on the right side, and 2+ on the left side.       Dorsalis pedis pulses are 2+ on the right side, and 2+ on the left side.  Pulmonary/Chest: Effort normal and breath sounds normal. No respiratory distress. He has no wheezes. He has no rhonchi. He has no rales.  Abdominal: Soft. Normal appearance and bowel sounds are normal. He exhibits no distension, no ascites and no mass. There is no hepatosplenomegaly. There is no tenderness. There is no rebound, no guarding and no CVA tenderness.  Musculoskeletal: Normal range of motion. He exhibits no edema  or tenderness.  Lymphadenopathy:    He has no cervical adenopathy.  Neurological: He is alert and oriented to person, place, and time. He has normal strength. No cranial nerve deficit or sensory deficit.  Skin: Skin is warm, dry and intact. No petechiae and no rash noted. He is not diaphoretic. No erythema. No pallor.  Psychiatric: He has a normal mood and affect. His behavior is normal. Judgment normal.  Nursing note and vitals reviewed.   ED Course  Procedures  DIAGNOSTIC STUDIES:  Oxygen Saturation is  100% on RA, normal by my interpretation.    COORDINATION OF CARE:  12:57 AM Discussed treatment plan with pt at bedside and pt agreed to plan.   Labs Review Labs Reviewed  BASIC METABOLIC PANEL  CBC  I-STAT [place], ED    Imaging Review Dg Chest 2 View  11/09/2014   CLINICAL DATA:  Left-sided chest pain  EXAM: CHEST - 2 VIEW  COMPARISON:  None.  FINDINGS: The heart size and mediastinal contours are within normal limits. Both lungs are clear. The visualized skeletal structures are unremarkable.  IMPRESSION: No active disease.   Electronically Signed   By: Inez Catalina M.D.   On: 11/09/2014 21:22   I have personally reviewed and evaluated these images and lab results as part of my medical decision-making.   EKG Interpretation   Date/Time:  Saturday November 09 2014 20:33:13 EDT Ventricular Rate:  72 PR Interval:  144 QRS Duration: 86 QT Interval:  376 QTC Calculation: 411 R Axis:   63 Text Interpretation:  Normal sinus rhythm Nonspecific ST abnormality  Abnormal ECG No significant change since last tracing Confirmed by Glynn Octave [phone]) on 11/10/2014 12:47:17 AM      MDM   Final diagnoses:  None   Patient presents emergency department for exertional chest pain associated with nausea and diaphoresis. I have concern for ACS. I spoke with Dr. Arnoldo Morale with [facility] to admit the patient for further management. Troponin is negative, EKG does not show any acute signs of ischemia. He was given aspirin and nitroglycerin in the emergency department.  I personally performed the services described in this documentation, which was scribed in my presence. The recorded information has been reviewed and is accurate.    Everlene Balls, MD 11/10/14 0140

## 2014-11-10 NOTE — Progress Notes (Signed)
Nursing note Patient given discharge instructions medication list and follow up appointment information on  [facility] in spanish. Pt verbalized understanding. Will discharge home as ordered. Cloer, Bettina Gavia RN

## 2014-11-22 ENCOUNTER — Emergency Department (HOSPITAL_COMMUNITY): Payer: Self-pay

## 2014-11-22 ENCOUNTER — Encounter (HOSPITAL_COMMUNITY): Payer: Self-pay | Admitting: Emergency Medicine

## 2014-11-22 ENCOUNTER — Emergency Department (HOSPITAL_COMMUNITY)
Admission: EM | Admit: 2014-11-22 | Discharge: 2014-11-22 | Disposition: A | Discharge status: Home / Self Care | Payer: Self-pay | Attending: Emergency Medicine | Admitting: Emergency Medicine

## 2014-11-22 DIAGNOSIS — L259 Unspecified contact dermatitis, unspecified cause: Secondary | ICD-10-CM | POA: Insufficient documentation

## 2014-11-22 DIAGNOSIS — Y9301 Activity, walking, marching and hiking: Secondary | ICD-10-CM | POA: Insufficient documentation

## 2014-11-22 DIAGNOSIS — Z87891 Personal history of nicotine dependence: Secondary | ICD-10-CM | POA: Insufficient documentation

## 2014-11-22 DIAGNOSIS — X58XXXA Exposure to other specified factors, initial encounter: Secondary | ICD-10-CM | POA: Insufficient documentation

## 2014-11-22 DIAGNOSIS — Y999 Unspecified external cause status: Secondary | ICD-10-CM | POA: Insufficient documentation

## 2014-11-22 DIAGNOSIS — Y9289 Other specified places as the place of occurrence of the external cause: Secondary | ICD-10-CM | POA: Insufficient documentation

## 2014-11-22 DIAGNOSIS — S93401A Sprain of unspecified ligament of right ankle, initial encounter: Secondary | ICD-10-CM | POA: Insufficient documentation

## 2014-11-22 MED ORDER — PREDNISONE 10 MG (21) PO TBPK: 10.0000 mg | ORAL_TABLET | Freq: Every day | ORAL | Status: DC

## 2014-11-22 MED ORDER — HYDROCODONE-ACETAMINOPHEN 5-325 MG PO TABS: 2.0000 | ORAL_TABLET | ORAL | Status: DC | PRN

## 2014-11-22 MED ORDER — DIPHENHYDRAMINE HCL 25 MG PO TABS: 25.0000 mg | ORAL_TABLET | Freq: Four times a day (QID) | ORAL | Status: DC

## 2014-11-22 MED ORDER — FAMOTIDINE 20 MG PO TABS: 20.0000 mg | ORAL_TABLET | Freq: Two times a day (BID) | ORAL | Status: DC

## 2014-11-22 NOTE — Discharge Instructions (Signed)
Ankle Sprain °An ankle sprain is an injury to the strong, fibrous tissues (ligaments) that hold the bones of your ankle joint together.  °CAUSES °An ankle sprain is usually caused by a fall or by twisting your ankle. Ankle sprains most commonly occur when you step on the outer edge of your foot, and your ankle turns inward. People who participate in sports are more prone to these types of injuries.  °SYMPTOMS  °· Pain in your ankle. The pain may be present at rest or only when you are trying to stand or walk. °· Swelling. °· Bruising. Bruising may develop immediately or within 1 to 2 days after your injury. °· Difficulty standing or walking, particularly when turning corners or changing directions. °DIAGNOSIS  °Your caregiver will ask you details about your injury and perform a physical exam of your ankle to determine if you have an ankle sprain. During the physical exam, your caregiver will press on and apply pressure to specific areas of your foot and ankle. Your caregiver will try to move your ankle in certain ways. An X-ray exam may be done to be sure a bone was not broken or a ligament did not separate from one of the bones in your ankle (avulsion fracture).  °TREATMENT  °Certain types of braces can help stabilize your ankle. Your caregiver can make a recommendation for this. Your caregiver may recommend the use of medicine for pain. If your sprain is severe, your caregiver may refer you to a surgeon who helps to restore function to parts of your skeletal system (orthopedist) or a physical therapist. °HOME CARE INSTRUCTIONS  °· Apply ice to your injury for 1-2 days or as directed by your caregiver. Applying ice helps to reduce inflammation and pain. °· Put ice in a plastic bag. °· Place a towel between your skin and the bag. °· Leave the ice on for 15-20 minutes at a time, every 2 hours while you are awake. °· Only take over-the-counter or prescription medicines for pain, discomfort, or fever as directed by  your caregiver. °· Elevate your injured ankle above the level of your heart as much as possible for 2-3 days. °· If your caregiver recommends crutches, use them as instructed. Gradually put weight on the affected ankle. Continue to use crutches or a cane until you can walk without feeling pain in your ankle. °· If you have a plaster splint, wear the splint as directed by your caregiver. Do not rest it on anything harder than a pillow for the first 24 hours. Do not put weight on it. Do not get it wet. You may take it off to take a shower or bath. °· You may have been given an elastic bandage to wear around your ankle to provide support. If the elastic bandage is too tight (you have numbness or tingling in your foot or your foot becomes cold and blue), adjust the bandage to make it comfortable. °· If you have an air splint, you may blow more air into it or let air out to make it more comfortable. You may take your splint off at night and before taking a shower or bath. Wiggle your toes in the splint several times per day to decrease swelling. °SEEK MEDICAL CARE IF:  °· You have rapidly increasing bruising or swelling. °· Your toes feel extremely cold or you lose feeling in your foot. °· Your pain is not relieved with medicine. °SEEK IMMEDIATE MEDICAL CARE IF: °· Your toes are numb or blue. °·   You have severe pain that is increasing. MAKE SURE YOU:   Understand these instructions.  Will watch your condition.  Will get help right away if you are not doing well or get worse.   This information is not intended to replace advice given to you by your health care provider. Make sure you discuss any questions you have with your health care provider.   Document Released: 02/01/2005 Document Revised: 02/22/2014 Document Reviewed: 02/13/2011 Elsevier Interactive Patient Education [employer].    Please keep your ankle elevated when you can.  Apply ice, indirectly to the skin, for no more than 20 minutes,  for pain and swelling.  Keep ankle in air splint to help with ankle stability.  Use crutches to help rest your ankle, which will help with healing, limiting pain and swelling.  Follow-up with Ortho referral for further evaluation of your ankle injury.    RICE therapy - Rest, Ice, Compression (splint), Elevation  Use NSAIDS as able - Aleve, ibuprofen, etc Pain meds given for severe pain - do not drive, work or operate machinery while taking.  Contact Dermatitis Dermatitis is redness, soreness, and swelling (inflammation) of the skin. Contact dermatitis is a reaction to certain substances that touch the skin. There are two types of contact dermatitis:   Irritant contact dermatitis. This type is caused by something that irritates your skin, such as dry hands from washing them too much. This type does not require previous exposure to the substance for a reaction to occur. This type is more common.  Allergic contact dermatitis. This type is caused by a substance that you are allergic to, such as a nickel allergy or poison ivy. This type only occurs if you have been exposed to the substance (allergen) before. Upon a repeat exposure, your body reacts to the substance. This type is less common. CAUSES  Many different substances can cause contact dermatitis. Irritant contact dermatitis is most commonly caused by exposure to:   Makeup.   Soaps.   Detergents.   Bleaches.   Acids.   Metal salts, such as nickel.  Allergic contact dermatitis is most commonly caused by exposure to:   Poisonous plants.   Chemicals.   Jewelry.   Latex.   Medicines.   Preservatives in products, such as clothing.  RISK FACTORS This condition is more likely to develop in:   People who have jobs that expose them to irritants or allergens.  People who have certain medical conditions, such as asthma or eczema.  SYMPTOMS  Symptoms of this condition may occur anywhere on your body where the irritant  has touched you or is touched by you. Symptoms include:  Dryness or flaking.   Redness.   Cracks.   Itching.   Pain or a burning feeling.   Blisters.  Drainage of small amounts of blood or clear fluid from skin cracks. With allergic contact dermatitis, there may also be swelling in areas such as the eyelids, mouth, or genitals.  DIAGNOSIS  This condition is diagnosed with a medical history and physical exam. A patch skin test may be performed to help determine the cause. If the condition is related to your job, you may need to see an occupational medicine specialist. TREATMENT Treatment for this condition includes figuring out what caused the reaction and protecting your skin from further contact. Treatment may also include:   Steroid creams or ointments. Oral steroid medicines may be needed in more severe cases.  Antibiotics or antibacterial ointments, if a  skin infection is present.  Antihistamine lotion or an antihistamine taken by mouth to ease itching.  A bandage (dressing). HOME CARE INSTRUCTIONS Skin Care  Moisturize your skin as needed.   Apply cool compresses to the affected areas.  Try taking a bath with:  Epsom salts. Follow the instructions on the packaging. You can get these at your local pharmacy or grocery store.  Baking soda. Pour a small amount into the bath as directed by your health care provider.  Colloidal oatmeal. Follow the instructions on the packaging. You can get this at your local pharmacy or grocery store.  Try applying baking soda paste to your skin. Stir water into baking soda until it reaches a paste-like consistency.  Do not scratch your skin.  Bathe less frequently, such as every other day.  Bathe in lukewarm water. Avoid using hot water. Medicines  Take or apply over-the-counter and prescription medicines only as told by your health care provider.   If you were prescribed an antibiotic medicine, take or apply your  antibiotic as told by your health care provider. Do not stop using the antibiotic even if your condition starts to improve. General Instructions  Keep all follow-up visits as told by your health care provider. This is important.  Avoid the substance that caused your reaction. If you do not know what caused it, keep a journal to try to track what caused it. Write down:  What you eat.  What cosmetic products you use.  What you drink.  What you wear in the affected area. This includes jewelry.  If you were given a dressing, take care of it as told by your health care provider. This includes when to change and remove it. SEEK MEDICAL CARE IF:   Your condition does not improve with treatment.  Your condition gets worse.  You have signs of infection such as swelling, tenderness, redness, soreness, or warmth in the affected area.  You have a fever.  You have new symptoms. SEEK IMMEDIATE MEDICAL CARE IF:   You have a severe headache, neck pain, or neck stiffness.  You vomit.  You feel very sleepy.  You notice red streaks coming from the affected area.  Your bone or joint underneath the affected area becomes painful after the skin has healed.  The affected area turns darker.  You have difficulty breathing.   This information is not intended to replace advice given to you by your health care provider. Make sure you discuss any questions you have with your health care provider.   Document Released: 01/30/2000 Document Revised: 10/23/2014 Document Reviewed: 06/19/2014 Elsevier Interactive Patient Education [employer].

## 2014-11-22 NOTE — ED Notes (Signed)
Pt c/o right foot pain. Pt reports that he twisted it 2 days ago.

## 2014-11-22 NOTE — ED Notes (Signed)
Pain, swelling and discoloration to right foot and ankle. States he "rolled" his ankle 2 days ago.

## 2014-11-22 NOTE — ED Provider Notes (Signed)
CSN: [id number]     Arrival date & time 11/22/14  3532 History  By signing my name below, I, Starleen Arms, attest that this documentation has been prepared under the direction and in the presence of Delsa Grana, PA-C. Electronically Signed: Starleen Arms ED Scribe. 11/22/2014. 11:38 AM.    Chief Complaint  Patient presents with  . Foot Injury   The history is provided by the patient. No language interpreter was used.   HPI Comments: Darren Burns is a 39 y.o. male who presents to the Emergency Department complaining of an inversion right ankle injury 2 days ago while walking out of his home and stepping on a gumball.  At the time of injury, he notes hearing pop.  There is associated, worsened right ankle pain and swelling that improved somewhat with a compression bandage.  He was able to ambulate 30 minutes after the injury with perceived joint instability.  He denies fever, vomiting, broken skin, sore throat.      Patient also reports an itching rash on his bilateral arms onset 3 weeks ago after working in his yard that he attributes to poison ivy.  Patient has taken Roland infrequently.  Patient does not use tobacco, denies DM.  History reviewed. No pertinent past medical history. History reviewed. No pertinent past surgical history. No family history on file. Social History  Substance Use Topics  . Smoking status: Former Research scientist (life sciences)  . Smokeless tobacco: None  . Alcohol Use: No    Review of Systems  Constitutional: Negative for fever.  HENT: Negative for facial swelling, sore throat, tinnitus, trouble swallowing and voice change.   Respiratory: Negative for cough, choking, chest tightness, shortness of breath, wheezing and stridor.   Cardiovascular: Negative.   Gastrointestinal: Negative.  Negative for nausea and vomiting.  Musculoskeletal: Positive for joint swelling and arthralgias.  Skin: Positive for rash. Negative for wound.      Allergies  Ibuprofen and Tamiflu  Home  Medications   Prior to Admission medications   Medication Sig Start Date End Date Taking? Authorizing Provider  acetaminophen (TYLENOL) 325 MG tablet Take 2 tablets (650 mg total) by mouth every 6 (six) hours as needed for mild pain or headache. 11/10/14   Domenic Polite, MD  diphenhydrAMINE (BENADRYL) 25 MG tablet Take 1 tablet (25 mg total) by mouth every 6 (six) hours. 11/22/14   Delsa Grana, PA-C  famotidine (PEPCID) 20 MG tablet Take 1 tablet (20 mg total) by mouth 2 (two) times daily. 11/22/14   Delsa Grana, PA-C  HYDROcodone-acetaminophen (NORCO/VICODIN) 5-325 MG tablet Take 2 tablets by mouth every 4 (four) hours as needed. 11/22/14   Delsa Grana, PA-C  predniSONE (STERAPRED UNI-PAK 21 TAB) 10 MG (21) TBPK tablet Take 1 tablet (10 mg total) by mouth daily. Take 6 tabs by mouth daily  for 2 days, then 5 tabs for 2 days, then 4 tabs for 2 days, then 3 tabs for 2 days, 2 tabs for 2 days, then 1 tab by mouth daily for 2 days 11/22/14   Delsa Grana, PA-C  UNABLE TO FIND This note is to excuse Mr.Caballero from work 9/24-9/25 due to hospitalization, ok to return to work 9/26 11/10/14   Domenic Polite, MD   BP 148/99 mmHg  Pulse 73  Temp(Src) 97.9 F (36.6 C) (Oral)  Resp 18  Ht 5\' 6"  (1.676 m)  Wt 180 lb (81.647 kg)  BMI 29.07 kg/m2  SpO2 100% Physical Exam  Constitutional: He is oriented to person, place, and time.  He appears well-developed and well-nourished. No distress.  HENT:  Head: Normocephalic and atraumatic.  Right Ear: External ear normal.  Left Ear: External ear normal.  Nose: Nose normal.  Mouth/Throat: Oropharynx is clear and moist. No oropharyngeal exudate.  Eyes: Conjunctivae and EOM are normal. Pupils are equal, round, and reactive to light. Right eye exhibits no discharge. Left eye exhibits no discharge. No scleral icterus.  Neck: Normal range of motion. Neck supple. No JVD present. No tracheal deviation present.  Cardiovascular: Normal rate, regular rhythm, normal heart  sounds and intact distal pulses.  Exam reveals no gallop and no friction rub.   No murmur heard. Pulmonary/Chest: Effort normal and breath sounds normal. No stridor. No respiratory distress. He has no wheezes. He has no rales. He exhibits no tenderness.  Musculoskeletal: He exhibits edema and tenderness.       Right ankle: He exhibits swelling. He exhibits normal range of motion, no ecchymosis, no deformity, no laceration and normal pulse. Tenderness. AITFL and CF ligament tenderness found. No lateral malleolus, no medial malleolus, no head of 5th metatarsal and no proximal fibula tenderness found. Achilles tendon exhibits no pain, no defect and normal Thompson's test results.  Right ankle lateral malleolus swelling  Lymphadenopathy:    He has no cervical adenopathy.  Neurological: He is alert and oriented to person, place, and time. He exhibits normal muscle tone. Coordination normal.  Skin: Skin is warm and dry. Rash noted. He is not diaphoretic. There is erythema. No pallor.  Diffuse maculopapular erythematous rash to bilateral arms, legs and trunk  Psychiatric: He has a normal mood and affect. His behavior is normal. Judgment and thought content normal.  Nursing note and vitals reviewed.   ED Course  Procedures (including critical care time)  DIAGNOSTIC STUDIES: Oxygen Saturation is 99% on RA, normal by my interpretation.    COORDINATION OF CARE:  10:05 AM Informed patient of negative imaging.  Will splint injury, provide crutches, and provide orthopaedic referral.  Will prescribe oral steroids for rash.  Patient acknowledges and agrees with plan.    Labs Review Labs Reviewed - No data to display  Imaging Review Dg Ankle Complete Right  11/22/2014   CLINICAL DATA:  Twisted RIGHT ankle 2 days ago, lateral malleolar and lateral RIGHT foot pain, trauma, initial encounter  EXAM: RIGHT ANKLE - COMPLETE 3+ VIEW  COMPARISON:  None  FINDINGS: Osseous mineralization normal.  Joint spaces  preserved.  Lateral soft tissue swelling.  No acute fracture, dislocation or bone destruction.  Accessory ossicle at lateral margin of cuboid.  Tiny phleboliths anterior soft tissues of the distal lower RIGHT leg.  IMPRESSION: No acute osseous abnormalities.   Electronically Signed   By: Lavonia Dana M.D.   On: 11/22/2014 09:26   Dg Foot Complete Right  11/22/2014   CLINICAL DATA:  Twisting injury to the right ankle 2 days ago. Complaining of lateral pain.  EXAM: RIGHT FOOT COMPLETE - 3+ VIEW  COMPARISON:  None.  FINDINGS: There is no evidence of fracture or dislocation. There is no evidence of arthropathy or other focal bone abnormality. Soft tissues are unremarkable.  IMPRESSION: Negative.   Electronically Signed   By: Lajean Manes M.D.   On: 11/22/2014 09:26   I have personally reviewed and evaluated these images and lab results as part of my medical decision-making.   EKG Interpretation None      MDM   Final diagnoses:  Ankle sprain, right, initial encounter  Contact dermatitis  Patient with right ankle sprain, states he has repeatedly rolled his right ankle and he endorses ankle instability X-ray negative for fracture, will stabilize with air splint, given crutches, reviewed Rice therapy, Ortho follow-up given  Patient also was covered in a rash over his arms, legs and trunk, stated he had touch poison ivy nearly 3 weeks ago, has not really sought any treatment. No facial or respiratory involvement. Prescriptions for allergic dermatitis given, prednisone, Benadryl, Pepcid.  Filed Vitals:   11/22/14 0855 11/22/14 0857 11/22/14 1033  BP: 135/91  148/99  Pulse: 71  73  Temp: 97.9 F (36.6 C)    TempSrc: Oral    Resp: 18  18  Height:  5\' 6"  (1.676 m)   Weight:  180 lb (81.647 kg)   SpO2: 99%  100%    I personally performed the services described in this documentation, which was scribed in my presence. The recorded information has been reviewed and is accurate.    Delsa Grana, PA-C 11/22/14 [place], MD 11/22/14 1154

## 2015-04-04 ENCOUNTER — Ambulatory Visit: Payer: Self-pay

## 2015-07-23 ENCOUNTER — Ambulatory Visit: Payer: Self-pay | Attending: Internal Medicine

## 2015-11-07 ENCOUNTER — Encounter (HOSPITAL_COMMUNITY): Payer: Self-pay | Admitting: *Deleted

## 2015-11-07 ENCOUNTER — Ambulatory Visit (HOSPITAL_COMMUNITY)
Admission: EM | Admit: 2015-11-07 | Discharge: 2015-11-07 | Disposition: A | Discharge status: Home / Self Care | Payer: Self-pay | Attending: Family Medicine | Admitting: Family Medicine

## 2015-11-07 DIAGNOSIS — J45901 Unspecified asthma with (acute) exacerbation: Secondary | ICD-10-CM

## 2015-11-07 DIAGNOSIS — K639 Disease of intestine, unspecified: Secondary | ICD-10-CM

## 2015-11-07 MED ORDER — ALBUTEROL SULFATE HFA 108 (90 BASE) MCG/ACT IN AERS: 2.0000 | INHALATION_SPRAY | RESPIRATORY_TRACT | 11 refills | Status: DC | PRN

## 2015-11-07 MED ORDER — BENZONATATE 100 MG PO CAPS: 100.0000 mg | ORAL_CAPSULE | Freq: Three times a day (TID) | ORAL | 1 refills | Status: DC | PRN

## 2015-11-07 NOTE — ED Provider Notes (Signed)
[place]    CSN: [id number] Arrival date & time: 11/07/15  1105  First Provider Contact:  First MD Initiated Contact with Patient 11/07/15 1200        History   Chief Complaint Chief Complaint  Patient presents with  . Cough    HPI Darren Burns is a 40 y.o. male.   Is a 40 year old man who presents with a cough. 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 He tries to wear a mask which helps. The cough started about a week ago and seem be getting better.  About 2 days ago he went out and mowed the lawn and the cough got much worse.  He's had to take inhalers in the past. He does not smoke. The cough is nonproductive does tend to keep him awake. In the past she's used Tessalon pearls and they've been very helpful.  Patient also has concerns about some left upper quadrant tenderness which comes from time to time, especially after eating. He's had a CAT scan which was normal. He was given MiraLAX but he vomited this up.  He's had no blood in his stools, recent nausea or vomiting, fever, or other abdominal pain. He also denies rash.      History reviewed. No pertinent past medical history.  Patient Active Problem List   Diagnosis Date Noted  . Chest pain 11/10/2014  . Pain in the chest   . Dental cavities 10/25/2013  . Annual physical exam 10/25/2013    History reviewed. No pertinent surgical history.     Home Medications    Prior to Admission medications   Medication Sig Start Date End Date Taking? Authorizing Provider  acetaminophen (TYLENOL) 325 MG tablet Take 2 tablets (650 mg total) by mouth every 6 (six) hours as needed for mild pain or headache. 11/10/14   Domenic Polite, MD  albuterol (PROVENTIL HFA;VENTOLIN HFA) 108 (90 Base) MCG/ACT inhaler Inhale 2 puffs into the lungs every 4 (four) hours as needed for wheezing or shortness of breath  (cough, shortness of breath or wheezing.). 11/07/15   Robyn Haber, MD  benzonatate (TESSALON) 100 MG capsule Take 1-2 capsules (100-200 mg total) by mouth 3 (three) times daily as needed for cough. 11/07/15   Robyn Haber, MD  HYDROcodone-acetaminophen (NORCO/VICODIN) 5-325 MG tablet Take 2 tablets by mouth every 4 (four) hours as needed. 11/22/14   Delsa Grana, PA-C    Family History History reviewed. No pertinent family history.  Social History Social History  Substance Use Topics  . Smoking status: Former Research scientist (life sciences)  . Smokeless tobacco: Not on file  . Alcohol use No     Allergies   Ibuprofen and Tamiflu [oseltamivir phosphate]   Review of Systems Review of Systems  Constitutional: Negative.   HENT: Negative.   Eyes: Negative.   Respiratory: Positive for cough. Negative for shortness of breath and wheezing.   Cardiovascular: Negative.   Gastrointestinal: Positive for abdominal pain. Negative for abdominal distention, blood in stool, diarrhea, nausea, rectal pain and vomiting.  Genitourinary: Negative.   Musculoskeletal: Negative.      Physical Exam Triage Vital Signs ED Triage Vitals [11/07/15 1135]  Enc Vitals Group     BP 124/78     Pulse Rate 81     Resp 14     Temp 99 F (37.2 C)     Temp Source Oral  SpO2 98 %     Weight      Height      Head Circumference      Peak Flow      Pain Score      Pain Loc      Pain Edu?      Excl. in [place]?    No data found.   Updated Vital Signs BP 124/78 (BP Location: Left Arm)   Pulse 81   Temp 99 F (37.2 C) (Oral)   Resp 14   SpO2 98%       Physical Exam  Constitutional: He is oriented to person, place, and time. He appears well-developed and well-nourished.  HENT:  Head: Normocephalic.  Right Ear: External ear normal.  Left Ear: External ear normal.  Nose: Nose normal.  Mouth/Throat: Oropharynx is clear and moist.  Eyes: Conjunctivae and EOM are normal. Pupils are equal, round, and reactive to light.   Neck: Normal range of motion. Neck supple.  Cardiovascular: Normal rate, regular rhythm, normal heart sounds and intact distal pulses.   Pulmonary/Chest: Effort normal and breath sounds normal.  Abdominal: Soft. Bowel sounds are normal.  Mild tenderness and fullness in the left upper quadrant  Musculoskeletal: Normal range of motion.  Neurological: He is alert and oriented to person, place, and time.  Skin: Skin is warm and dry. No rash noted.  Nursing note and vitals reviewed.    UC Treatments / Results  Labs (all labs ordered are listed, but only abnormal results are displayed) Labs Reviewed - No data to display  EKG  EKG Interpretation None       Radiology No results found.  Procedures Procedures (including critical care time)  Medications Ordered in UC Medications - No data to display   Initial Impression / Assessment and Plan / UC Course  I have reviewed the triage vital signs and the nursing notes.  Pertinent labs & imaging results that were available during my care of the patient were reviewed by me and considered in my medical decision making (see chart for details).  Clinical Course     Final Clinical Impressions(s) / UC Diagnoses   Final diagnoses:  Asthma exacerbation  Splenic flexure syndrome    New Prescriptions New Prescriptions   ALBUTEROL (PROVENTIL HFA;VENTOLIN HFA) 108 (90 BASE) MCG/ACT INHALER    Inhale 2 puffs into the lungs every 4 (four) hours as needed for wheezing or shortness of breath (cough, shortness of breath or wheezing.).   BENZONATATE (TESSALON) 100 MG CAPSULE    Take 1-2 capsules (100-200 mg total) by mouth 3 (three) times daily as needed for cough.     Robyn Haber, MD 11/07/15 [phone]

## 2015-11-07 NOTE — ED Triage Notes (Signed)
Pt  Reports     Symptoms  Of         Cough          Stomach  Also reports    Symptoms   Of    Buzzing     In  Ears     Also reports   Symptoms   Of  abd   Pain   With  Nausea

## 2015-11-07 NOTE — Discharge Instructions (Signed)
You have a mild form of asthma which comes after significant exposure to dust or after a upper respiratory infection. Taking the inhaler and cough medicine should take care of this problem for you.  You also have a narrowing of your colon in the left side which causes a discomfort. For this, taking milk of magnesia should solve the problem when it occurs.

## 2015-12-17 ENCOUNTER — Encounter: Payer: Self-pay | Admitting: Family Medicine

## 2015-12-17 ENCOUNTER — Ambulatory Visit: Payer: Self-pay | Attending: Family Medicine | Admitting: Family Medicine

## 2015-12-17 VITALS — BP 122/80 | HR 80 | Temp 98.1°F | Ht 65.0 in | Wt 199.6 lb

## 2015-12-17 DIAGNOSIS — Z888 Allergy status to other drugs, medicaments and biological substances status: Secondary | ICD-10-CM | POA: Insufficient documentation

## 2015-12-17 DIAGNOSIS — Z13228 Encounter for screening for other metabolic disorders: Secondary | ICD-10-CM

## 2015-12-17 DIAGNOSIS — R51 Headache: Secondary | ICD-10-CM | POA: Insufficient documentation

## 2015-12-17 DIAGNOSIS — H6123 Impacted cerumen, bilateral: Secondary | ICD-10-CM | POA: Insufficient documentation

## 2015-12-17 DIAGNOSIS — M25561 Pain in right knee: Secondary | ICD-10-CM | POA: Insufficient documentation

## 2015-12-17 DIAGNOSIS — Z886 Allergy status to analgesic agent status: Secondary | ICD-10-CM | POA: Insufficient documentation

## 2015-12-17 DIAGNOSIS — Z23 Encounter for immunization: Secondary | ICD-10-CM

## 2015-12-17 DIAGNOSIS — R1012 Left upper quadrant pain: Secondary | ICD-10-CM | POA: Insufficient documentation

## 2015-12-17 DIAGNOSIS — R42 Dizziness and giddiness: Secondary | ICD-10-CM | POA: Insufficient documentation

## 2015-12-17 MED ORDER — PREDNISONE 20 MG PO TABS: 20.0000 mg | ORAL_TABLET | Freq: Every day | ORAL | 0 refills | Status: DC

## 2015-12-17 MED ORDER — CARBAMIDE PEROXIDE 6.5 % OT SOLN: 5.0000 [drp] | Freq: Two times a day (BID) | OTIC | 0 refills | Status: DC

## 2015-12-17 NOTE — Progress Notes (Signed)
Subjective:  Patient ID: Darren Burns, male    DOB: Feb 10, 1976  Age: 40 y.o. MRN: [id number]  CC: Annual Exam; ear noise (ringing in ears); Abdominal Pain (left side); Constipation; Knee Pain (right side); Dizziness (kept him in bed for a half day); and Headache   HPI Darren Burns presentsToday complaining of popping of his right knee for the last few weeks and mild pain when he walks. Denies swelling and has no history of trauma.  Complains of tinnitus in both ears for months and associated "vibration" in his ears when he listens to music from speakers. Denies hearing loss, nausea but occasionally has dizziness  He has left upper quadrant pain which is intermittent and worse with meals; described as sharp. Was previously informed he had constipation and moves his bowels every other day.  History reviewed. No pertinent past medical history.  History reviewed. No pertinent surgical history.  Allergies  Allergen Reactions  . Ibuprofen Swelling and Other (See Comments)    intolerance  . Tamiflu [Oseltamivir Phosphate] Nausea And Vomiting     Outpatient Medications Prior to Visit  Medication Sig Dispense Refill  . acetaminophen (TYLENOL) 325 MG tablet Take 2 tablets (650 mg total) by mouth every 6 (six) hours as needed for mild pain or headache.    . albuterol (PROVENTIL HFA;VENTOLIN HFA) 108 (90 Base) MCG/ACT inhaler Inhale 2 puffs into the lungs every 4 (four) hours as needed for wheezing or shortness of breath (cough, shortness of breath or wheezing.). (Patient not taking: Reported on 12/17/2015) 1 Inhaler 11  . benzonatate (TESSALON) 100 MG capsule Take 1-2 capsules (100-200 mg total) by mouth 3 (three) times daily as needed for cough. (Patient not taking: Reported on 12/17/2015) 40 capsule 1  . HYDROcodone-acetaminophen (NORCO/VICODIN) 5-325 MG tablet Take 2 tablets by mouth every 4 (four) hours as needed. (Patient not taking: Reported on 12/17/2015) 6 tablet 0   No  facility-administered medications prior to visit.     ROS Review of Systems  Constitutional: Negative for activity change and appetite change.  HENT: Positive for tinnitus. Negative for sinus pressure and sore throat.   Eyes: Negative for visual disturbance.  Respiratory: Negative for cough, chest tightness and shortness of breath.   Cardiovascular: Negative for chest pain and leg swelling.  Gastrointestinal: Positive for abdominal pain. Negative for abdominal distention, constipation and diarrhea.  Endocrine: Negative.   Genitourinary: Negative for dysuria.  Musculoskeletal: Negative for joint swelling and myalgias.       Right knee popping  Skin: Negative for rash.  Allergic/Immunologic: Negative.   Neurological: Negative for weakness and numbness.  Psychiatric/Behavioral: Negative for dysphoric mood and suicidal ideas.    Objective:  BP 122/80 (BP Location: Right Arm, Patient Position: Sitting, Cuff Size: Large)   Pulse 80   Temp 98.1 F (36.7 C) (Oral)   Ht 5\' 5"  (1.651 m)   Wt 199 lb 9.6 oz (90.5 kg)   SpO2 99%   BMI 33.22 kg/m   BP/Weight 12/17/2015 11/07/2015 [id number]  Systolic BP [id number]  Diastolic BP 80 78 99  Wt. (Lbs) 199.6 - 180  BMI 33.22 - 29.07      Physical Exam  Constitutional: He is oriented to person, place, and time. He appears well-developed and well-nourished.  HENT:  Mouth/Throat: Oropharynx is clear and moist.  Cerumen obscuring both TMs  Cardiovascular: Normal rate, normal heart sounds and intact distal pulses.   No murmur heard. Pulmonary/Chest: Effort normal and breath sounds normal. He has no  wheezes. He has no rales. He exhibits no tenderness.  Abdominal: Soft. Bowel sounds are normal. He exhibits no distension and no mass. There is no tenderness.  Musculoskeletal: Normal range of motion.  Crepitus in both knees  Neurological: He is alert and oriented to person, place, and time.     Assessment & Plan:   1. Impacted cerumen of  both ears This could explain tinnitus - carbamide peroxide (DEBROX) 6.5 % otic solution; Place 5 drops into both ears 2 (two) times daily.  Dispense: 15 mL; Refill: 0  2. Acute pain of right knee Pain is minimal Has allergic reactions ibuprofen so unable to place on NSAIDs Advised to use Tylenol as needed Reassess for improvement at next visit - predniSONE (DELTASONE) 20 MG tablet; Take 1 tablet (20 mg total) by mouth daily with breakfast.  Dispense: 5 tablet; Refill: 0  3. Abdominal pain, left upper quadrant Likely secondary to underlying constipation Advised to increase fiber  4. Screening for metabolic disorder - COMPLETE METABOLIC PANEL WITH GFR; Future - Lipid panel; Future   Meds ordered this encounter  Medications  . predniSONE (DELTASONE) 20 MG tablet    Sig: Take 1 tablet (20 mg total) by mouth daily with breakfast.    Dispense:  5 tablet    Refill:  0  . carbamide peroxide (DEBROX) 6.5 % otic solution    Sig: Place 5 drops into both ears 2 (two) times daily.    Dispense:  15 mL    Refill:  0    Follow-up: Return in about 1 month (around 01/16/2016) for Follow-up: Right knee pain.   Arnoldo Morale MD

## 2015-12-22 ENCOUNTER — Ambulatory Visit: Payer: Self-pay | Attending: Family Medicine

## 2015-12-22 DIAGNOSIS — Z13228 Encounter for screening for other metabolic disorders: Secondary | ICD-10-CM | POA: Insufficient documentation

## 2015-12-22 LAB — COMPLETE METABOLIC PANEL WITH GFR
ALBUMIN: 4 g/dL (ref 3.6–5.1)
ALK PHOS: 73 U/L (ref 40–115)
ALT: 32 U/L (ref 9–46)
AST: 21 U/L (ref 10–40)
BILIRUBIN TOTAL: 0.5 mg/dL (ref 0.2–1.2)
BUN: 11 mg/dL (ref 7–25)
CO2: 26 mmol/L (ref 20–31)
CREATININE: 0.91 mg/dL (ref 0.60–1.35)
Calcium: 9.4 mg/dL (ref 8.6–10.3)
Chloride: 104 mmol/L (ref 98–110)
GFR, Est African American: >89 mL/min (ref >=60)
GLUCOSE: 97 mg/dL (ref 65–99)
Potassium: 4.8 mmol/L (ref 3.5–5.3)
SODIUM: 138 mmol/L (ref 135–146)
Total Protein: 6.9 g/dL (ref 6.1–8.1)

## 2015-12-22 LAB — LIPID PANEL
Cholesterol: 163 mg/dL (ref <200)
HDL: 37 mg/dL — AB (ref >40)
LDL CALC: 98 mg/dL
Total CHOL/HDL Ratio: 4.4 Ratio (ref <5.0)
Triglycerides: 139 mg/dL (ref <150)
VLDL: 28 mg/dL (ref <30)

## 2015-12-22 NOTE — Progress Notes (Signed)
Patient here for lab visit only 

## 2015-12-23 ENCOUNTER — Telehealth: Payer: Self-pay

## 2015-12-23 NOTE — Telephone Encounter (Signed)
-----   Message from Arnoldo Morale, MD sent at 12/23/2015  2:02 PM EST ----- Please inform the patient that labs are normal. Thank you.

## 2015-12-23 NOTE — Telephone Encounter (Signed)
Writer called patient per Dr. Jarold Song and LVM requesting a call back to discuss his lab results.

## 2015-12-31 NOTE — Progress Notes (Signed)
After attempting to call patient several times writer sent a letter to his home address regarding his lab results.

## 2016-01-23 ENCOUNTER — Ambulatory Visit: Payer: Self-pay

## 2016-01-23 ENCOUNTER — Other Ambulatory Visit: Payer: Self-pay | Admitting: Occupational Medicine

## 2016-01-23 DIAGNOSIS — M25572 Pain in left ankle and joints of left foot: Secondary | ICD-10-CM

## 2016-06-30 ENCOUNTER — Ambulatory Visit: Payer: Self-pay | Attending: Family Medicine

## 2016-07-14 ENCOUNTER — Encounter: Payer: Self-pay | Admitting: Family Medicine

## 2016-07-14 ENCOUNTER — Ambulatory Visit: Payer: Self-pay | Attending: Family Medicine | Admitting: Family Medicine

## 2016-07-14 ENCOUNTER — Other Ambulatory Visit: Payer: Self-pay

## 2016-07-14 VITALS — BP 130/83 | HR 74 | Temp 97.9°F | Wt 198.2 lb

## 2016-07-14 DIAGNOSIS — F419 Anxiety disorder, unspecified: Secondary | ICD-10-CM | POA: Insufficient documentation

## 2016-07-14 DIAGNOSIS — K219 Gastro-esophageal reflux disease without esophagitis: Secondary | ICD-10-CM | POA: Insufficient documentation

## 2016-07-14 DIAGNOSIS — R0789 Other chest pain: Secondary | ICD-10-CM | POA: Insufficient documentation

## 2016-07-14 DIAGNOSIS — Z888 Allergy status to other drugs, medicaments and biological substances status: Secondary | ICD-10-CM | POA: Insufficient documentation

## 2016-07-14 DIAGNOSIS — Z886 Allergy status to analgesic agent status: Secondary | ICD-10-CM | POA: Insufficient documentation

## 2016-07-14 MED ORDER — OMEPRAZOLE 20 MG PO CPDR: 20.0000 mg | DELAYED_RELEASE_CAPSULE | Freq: Every day | ORAL | 3 refills | Status: DC

## 2016-07-14 NOTE — Progress Notes (Signed)
Subjective:  Patient ID: Darren Burns, male    DOB: 1975-11-10  Age: 41 y.o. MRN: [id number]  CC: Chest Pain   HPI Darren Burns is a 41 year old male who presents to the clinic complaining of left-sided chest pain for the last 1 month which has been intermittent and sometimes occur when he is frustrated at other times have occurred during sleep. He informs me that he wakes up at other times "not feeling his arms". Denies shortness of breath, pedal edema.  He endorses a previous history of reflux but states he modified his diet to control of symptoms.  Review of his chart indicates he had a 2-D echo from 10/2014 which revealed   EF of 60-65%, no regional wall motion abnormalities and this was done when he had presented with  a near syncopal episode which he informs me happened after he had a heated discussion with his son.   He endorses some form of occasional anxiety   No past medical history on file.  No past surgical history on file.  Allergies  Allergen Reactions  . Ibuprofen Swelling and Other (See Comments)    intolerance  . Tamiflu [Oseltamivir Phosphate] Nausea And Vomiting     Outpatient Medications Prior to Visit  Medication Sig Dispense Refill  . acetaminophen (TYLENOL) 325 MG tablet Take 2 tablets (650 mg total) by mouth every 6 (six) hours as needed for mild pain or headache.    . albuterol (PROVENTIL HFA;VENTOLIN HFA) 108 (90 Base) MCG/ACT inhaler Inhale 2 puffs into the lungs every 4 (four) hours as needed for wheezing or shortness of breath (cough, shortness of breath or wheezing.). 1 Inhaler 11  . carbamide peroxide (DEBROX) 6.5 % otic solution Place 5 drops into both ears 2 (two) times daily. (Patient not taking: Reported on 07/14/2016) 15 mL 0  . predniSONE (DELTASONE) 20 MG tablet Take 1 tablet (20 mg total) by mouth daily with breakfast. (Patient not taking: Reported on 07/14/2016) 5 tablet 0   No facility-administered medications prior to visit.       ROS Review of Systems  Constitutional: Negative for activity change and appetite change.  HENT: Negative for sinus pressure and sore throat.   Eyes: Negative for visual disturbance.  Respiratory: Negative for cough, chest tightness and shortness of breath.   Cardiovascular: Negative for chest pain and leg swelling.  Gastrointestinal: Negative for abdominal distention, abdominal pain, constipation and diarrhea.  Endocrine: Negative.   Genitourinary: Negative for dysuria.  Musculoskeletal: Negative for joint swelling and myalgias.  Skin: Negative for rash.  Allergic/Immunologic: Negative.   Neurological: Negative for weakness, light-headedness and numbness.  Psychiatric/Behavioral: Negative for dysphoric mood and suicidal ideas.       Positive for occasional anxiety    Objective:  BP 130/83   Pulse 74   Temp 97.9 F (36.6 C) (Oral)   Wt 198 lb 3.2 oz (89.9 kg)   SpO2 97%   BMI 32.98 kg/m   BP/Weight 07/14/2016 12/17/2015 03/13/5168  Systolic BP 017 494 496  Diastolic BP 83 80 78  Wt. (Lbs) 198.2 199.6 -  BMI 32.98 33.22 -      Physical Exam  Constitutional: He is oriented to person, place, and time. He appears well-developed and well-nourished.  Cardiovascular: Normal rate, normal heart sounds and intact distal pulses.   No murmur heard. Pulmonary/Chest: Effort normal and breath sounds normal. He has no wheezes. He has no rales. He exhibits no tenderness.  Abdominal: Soft. Bowel sounds are normal. He exhibits  no distension and no mass. There is no tenderness.  Musculoskeletal: Normal range of motion.  Neurological: He is alert and oriented to person, place, and time.  Skin: Skin is warm and dry.  Psychiatric: He has a normal mood and affect.     Assessment & Plan:   1. Atypical chest pain EKG is reassuring We'll treat for reflux and reassess at next visit Discussed dietary modifications - omeprazole (PRILOSEC) 20 MG capsule; Take 1 capsule (20 mg total) by  mouth daily.  Dispense: 30 capsule; Refill: 3  2. Anxiety Chest pain could be stemming from underlying anxiety as he has had an episode of lightheadedness after arguing with his son. Refuses initiation of medications We have discussed deep breathing techniques   Meds ordered this encounter  Medications  . omeprazole (PRILOSEC) 20 MG capsule    Sig: Take 1 capsule (20 mg total) by mouth daily.    Dispense:  30 capsule    Refill:  3    Follow-up: Return in about 3 months (around 10/14/2016) for Follow-up on chest pain.   Arnoldo Morale MD

## 2016-08-17 ENCOUNTER — Encounter: Payer: Self-pay | Admitting: Family Medicine

## 2016-08-17 ENCOUNTER — Ambulatory Visit: Payer: Self-pay | Attending: Family Medicine | Admitting: Family Medicine

## 2016-08-17 VITALS — BP 133/90 | HR 76 | Temp 98.0°F | Resp 16 | Wt 196.0 lb

## 2016-08-17 DIAGNOSIS — R0789 Other chest pain: Secondary | ICD-10-CM | POA: Insufficient documentation

## 2016-08-17 DIAGNOSIS — R141 Gas pain: Secondary | ICD-10-CM | POA: Insufficient documentation

## 2016-08-17 DIAGNOSIS — K219 Gastro-esophageal reflux disease without esophagitis: Secondary | ICD-10-CM | POA: Insufficient documentation

## 2016-08-17 DIAGNOSIS — Z87891 Personal history of nicotine dependence: Secondary | ICD-10-CM | POA: Insufficient documentation

## 2016-08-17 DIAGNOSIS — Z79899 Other long term (current) drug therapy: Secondary | ICD-10-CM | POA: Insufficient documentation

## 2016-08-17 HISTORY — DX: Gastro-esophageal reflux disease without esophagitis: K21.9

## 2016-08-17 NOTE — Progress Notes (Signed)
CC: Follow-up  HPI: Darren Burns is a 41 y.o. male who was commenced on omeprazole at his last office visit for atypical chest pain which was thought to be of GI origin and he reports resolution of symptoms. He however complains of intermittent pain beneath both ribs which is absent at this time. Pain is unrelated to activity and he sometimes notices it after he has taken the second cup of coffee.  Patient has No headache, No chest pain, No Nausea, No new weakness tingling or numbness, No Cough - SOB.   Allergies  Allergen Reactions  . Ibuprofen Swelling and Other (See Comments)    intolerance  . Tamiflu [Oseltamivir Phosphate] Nausea And Vomiting   No past medical history on file. Current Outpatient Prescriptions on File Prior to Visit  Medication Sig Dispense Refill  . acetaminophen (TYLENOL) 325 MG tablet Take 2 tablets (650 mg total) by mouth every 6 (six) hours as needed for mild pain or headache.    Marland Kitchen omeprazole (PRILOSEC) 20 MG capsule Take 1 capsule (20 mg total) by mouth daily. 30 capsule 3   No current facility-administered medications on file prior to visit.    No family history on file. Social History   Social History  . Marital status: Married    Spouse name: N/A  . Number of children: N/A  . Years of education: N/A   Occupational History  . Not on file.   Social History Main Topics  . Smoking status: Former Smoker    Quit date: 2005  . Smokeless tobacco: Never Used  . Alcohol use No  . Drug use: No  . Sexual activity: Not on file   Other Topics Concern  . Not on file   Social History Narrative  . No narrative on file    Review of Systems: Constitutional: Negative for fever, chills, diaphoresis, activity change, appetite change and fatigue. HENT: Negative for ear pain, nosebleeds, congestion, facial swelling, rhinorrhea, neck pain, neck stiffness and ear discharge.  Eyes: Negative for pain, discharge, redness, itching and visual  disturbance. Respiratory: Negative for cough, choking, chest tightness, shortness of breath, wheezing and stridor.  Cardiovascular: Negative for chest pain, palpitations and leg swelling. Gastrointestinal: Negative for abdominal distention. Genitourinary: Negative for dysuria, urgency, frequency, hematuria, flank pain, decreased urine volume, difficulty urinating and dyspareunia.  Musculoskeletal: Negative for back pain, joint swelling, arthralgias and gait problem. Neurological: Negative for dizziness, tremors, seizures, syncope, facial asymmetry, speech difficulty, weakness, light-headedness, numbness and headaches.  Hematological: Negative for adenopathy. Does not bruise/bleed easily. Psychiatric/Behavioral: Negative for hallucinations, behavioral problems, confusion, dysphoric mood, decreased concentration and agitation.    Objective:   Vitals:   08/17/16 1555  BP: 133/90  Pulse: 76  Resp: 16  Temp: 98 F (36.7 C)    Physical Exam: Constitutional: Patient appears well-developed and well-nourished. No distress. HENT: Normocephalic, atraumatic, External right and left ear normal. Oropharynx is clear and moist.  Eyes: Conjunctivae and EOM are normal. PERRLA, no scleral icterus. Neck: Normal ROM. Neck supple. No JVD. No tracheal deviation. No thyromegaly. CVS: RRR, S1/S2 +, no murmurs, no gallops, no carotid bruit.  Pulmonary: Effort and breath sounds normal, no stridor, rhonchi, wheezes, rales.  Abdominal: Soft. BS +,  no distension, tenderness, rebound or guarding.  Musculoskeletal: Normal range of motion. No edema and no tenderness.  Lymphadenopathy: No lymphadenopathy noted, cervical, inguinal or axillary Neuro: Alert. Normal reflexes, muscle tone coordination. No cranial nerve deficit. Skin: Skin is warm and dry. No rash noted. Not  diaphoretic. No erythema. No pallor. Psychiatric: Normal mood and affect. Behavior, judgment, thought content normal.  Lab Results  Component  Value Date   WBC 7.4 11/09/2014   HGB 15.7 11/09/2014   HCT 43.9 11/09/2014   MCV 86.2 11/09/2014   PLT 246 11/09/2014   Lab Results  Component Value Date   CREATININE 0.91 12/22/2015   BUN 11 12/22/2015   NA 138 12/22/2015   K 4.8 12/22/2015   CL 104 12/22/2015   CO2 26 12/22/2015    No results found for: HGBA1C Lipid Panel     Component Value Date/Time   CHOL 163 12/22/2015 0905   TRIG 139 12/22/2015 0905   HDL 37 (L) 12/22/2015 0905   CHOLHDL 4.4 12/22/2015 0905   VLDL 28 12/22/2015 0905   LDLCALC 98 12/22/2015 0905        Assessment and plan:  GERD Controlled on omeprazole Avoid late meals Avoid recumbency up to 2 hours after meals  Gas pains Advised to use OTC simethicone Avoid foods that trigger symptoms  Arnoldo Morale, MD. [facility] [phone] 08/17/2016, 4:39 PM

## 2016-08-17 NOTE — Patient Instructions (Signed)
Food Choices for Gastroesophageal Reflux Disease, Adult When you have gastroesophageal reflux disease (GERD), the foods you eat and your eating habits are very important. Choosing the right foods can help ease your discomfort. What guidelines do I need to follow?  Choose fruits, vegetables, whole grains, and low-fat dairy products.  Choose low-fat meat, fish, and poultry.  Limit fats such as oils, salad dressings, butter, nuts, and avocado.  Keep a food diary. This helps you identify foods that cause symptoms.  Avoid foods that cause symptoms. These may be different for everyone.  Eat small meals often instead of 3 large meals a day.  Eat your meals slowly, in a place where you are relaxed.  Limit fried foods.  Cook foods using methods other than frying.  Avoid drinking alcohol.  Avoid drinking large amounts of liquids with your meals.  Avoid bending over or lying down until 2-3 hours after eating. What foods are not recommended? These are some foods and drinks that may make your symptoms worse: Vegetables  Tomatoes. Tomato juice. Tomato and spaghetti sauce. Chili peppers. Onion and garlic. Horseradish. Fruits  Oranges, grapefruit, and lemon (fruit and juice). Meats  High-fat meats, fish, and poultry. This includes hot dogs, ribs, ham, sausage, salami, and bacon. Dairy  Whole milk and chocolate milk. Sour cream. Cream. Butter. Ice cream. Cream cheese. Drinks  Coffee and tea. Bubbly (carbonated) drinks or energy drinks. Condiments  Hot sauce. Barbecue sauce. Sweets/Desserts  Chocolate and cocoa. Donuts. Peppermint and spearmint. Fats and Oils  High-fat foods. This includes French fries and potato chips. Other  Vinegar. Strong spices. This includes black pepper, white pepper, red pepper, cayenne, curry powder, cloves, ginger, and chili powder. The items listed above may not be a complete list of foods and drinks to avoid. Contact your dietitian for more information.    This information is not intended to replace advice given to you by your health care provider. Make sure you discuss any questions you have with your health care provider. Document Released: 08/03/2011 Document Revised: 07/10/2015 Document Reviewed: 12/06/2012 Elsevier Interactive Patient Education  2017 [employer]  

## 2016-10-19 ENCOUNTER — Encounter: Payer: Self-pay | Admitting: Family Medicine

## 2016-10-19 ENCOUNTER — Ambulatory Visit: Payer: Self-pay | Attending: Family Medicine | Admitting: Family Medicine

## 2016-10-19 VITALS — BP 144/99 | HR 77 | Temp 98.7°F | Ht 65.0 in | Wt 192.0 lb

## 2016-10-19 DIAGNOSIS — R109 Unspecified abdominal pain: Secondary | ICD-10-CM | POA: Insufficient documentation

## 2016-10-19 DIAGNOSIS — Z23 Encounter for immunization: Secondary | ICD-10-CM

## 2016-10-19 DIAGNOSIS — K219 Gastro-esophageal reflux disease without esophagitis: Secondary | ICD-10-CM | POA: Insufficient documentation

## 2016-10-19 DIAGNOSIS — Z800 Family history of malignant neoplasm of digestive organs: Secondary | ICD-10-CM | POA: Insufficient documentation

## 2016-10-19 DIAGNOSIS — R400 Somnolence: Secondary | ICD-10-CM | POA: Insufficient documentation

## 2016-10-19 DIAGNOSIS — R079 Chest pain, unspecified: Secondary | ICD-10-CM | POA: Insufficient documentation

## 2016-10-19 MED ORDER — OMEPRAZOLE 20 MG PO CPDR: 20.0000 mg | DELAYED_RELEASE_CAPSULE | Freq: Every day | ORAL | 3 refills | Status: DC

## 2016-10-19 NOTE — Progress Notes (Signed)
Subjective:  Patient ID: Darren Burns, male    DOB: 04-24-1975  Age: 41 y.o. MRN: [id number]  CC: Abdominal Pain and Chest Pain   HPI Darren Burns is a 41 year old male with a history of GERD who had presented at his last visit with atypical chest pain and abdominal pain and was commenced on omeprazole. He reports improvement in symptoms with improvement in his bloating as well. He does have somnolence in the early mornings which he attributes to omeprazole but then goes on to state that he gets only 3-5 hours of sleep and is unsure if that is responsible.  He informs me of a family history of pancreatic cancer and is wondering how he can be screened for this. We have discussed that at this time he has no symptoms that would warrant imaging or testing, no guidelines for screening tests at this time. He will notify us in the event that he develops any symptoms.  No past medical history on file.  No past surgical history on file.  Allergies  Allergen Reactions  . Ibuprofen Swelling and Other (See Comments)    intolerance  . Tamiflu [Oseltamivir Phosphate] Nausea And Vomiting     Outpatient Medications Prior to Visit  Medication Sig Dispense Refill  . acetaminophen (TYLENOL) 325 MG tablet Take 2 tablets (650 mg total) by mouth every 6 (six) hours as needed for mild pain or headache.    Marland Kitchen omeprazole (PRILOSEC) 20 MG capsule Take 1 capsule (20 mg total) by mouth daily. 30 capsule 3   No facility-administered medications prior to visit.     ROS Review of Systems  Constitutional: Negative for activity change and appetite change.  HENT: Negative for sinus pressure and sore throat.   Eyes: Negative for visual disturbance.  Respiratory: Negative for cough, chest tightness and shortness of breath.   Cardiovascular: Negative for chest pain and leg swelling.  Gastrointestinal: Negative for abdominal distention, abdominal pain, constipation and diarrhea.  Endocrine:  Negative.   Genitourinary: Negative for dysuria.  Musculoskeletal: Negative for joint swelling and myalgias.  Skin: Negative for rash.  Allergic/Immunologic: Negative.   Neurological: Negative for weakness, light-headedness and numbness.  Psychiatric/Behavioral: Negative for dysphoric mood and suicidal ideas.    Objective:  BP (!) 144/99   Pulse 77   Temp 98.7 F (37.1 C) (Oral)   Ht 5\' 5"  (1.651 m)   Wt 192 lb (87.1 kg)   SpO2 98%   BMI 31.95 kg/m   BP/Weight 10/19/2016 08/17/2016 03/31/863  Systolic BP 784 696 295  Diastolic BP 99 90 83  Wt. (Lbs) 192 196 198.2  BMI 31.95 32.62 32.98      Physical Exam  Constitutional: He is oriented to person, place, and time. He appears well-developed and well-nourished.  Cardiovascular: Normal rate, normal heart sounds and intact distal pulses.   No murmur heard. Pulmonary/Chest: Effort normal and breath sounds normal. He has no wheezes. He has no rales. He exhibits no tenderness.  Abdominal: Soft. Bowel sounds are normal. He exhibits no distension and no mass. There is no tenderness.  Musculoskeletal: Normal range of motion.  Neurological: He is alert and oriented to person, place, and time.  Skin: Skin is warm and dry.  Psychiatric: He has a normal mood and affect.     Assessment & Plan:   1. Gastroesophageal reflux disease without esophagitis Stable - omeprazole (PRILOSEC) 20 MG capsule; Take 1 capsule (20 mg total) by mouth daily.  Dispense: 30 capsule; Refill: 3  2.  Need for influenza vaccination Flu vaccine administered   Meds ordered this encounter  Medications  . omeprazole (PRILOSEC) 20 MG capsule    Sig: Take 1 capsule (20 mg total) by mouth daily.    Dispense:  30 capsule    Refill:  3    Follow-up: Return in about 3 months (around 01/18/2017) for complete physical exam.   Arnoldo Morale MD

## 2016-11-17 ENCOUNTER — Ambulatory Visit: Payer: Self-pay | Admitting: Family Medicine

## 2016-11-26 ENCOUNTER — Ambulatory Visit: Payer: Self-pay | Attending: Family Medicine | Admitting: Family Medicine

## 2016-11-26 ENCOUNTER — Encounter: Payer: Self-pay | Admitting: Family Medicine

## 2016-11-26 VITALS — BP 127/79 | HR 68 | Temp 98.1°F | Ht 65.0 in | Wt 188.2 lb

## 2016-11-26 DIAGNOSIS — Z13228 Encounter for screening for other metabolic disorders: Secondary | ICD-10-CM | POA: Insufficient documentation

## 2016-11-26 DIAGNOSIS — K5900 Constipation, unspecified: Secondary | ICD-10-CM | POA: Insufficient documentation

## 2016-11-26 DIAGNOSIS — R109 Unspecified abdominal pain: Secondary | ICD-10-CM | POA: Insufficient documentation

## 2016-11-26 DIAGNOSIS — K219 Gastro-esophageal reflux disease without esophagitis: Secondary | ICD-10-CM | POA: Insufficient documentation

## 2016-11-26 DIAGNOSIS — Z79899 Other long term (current) drug therapy: Secondary | ICD-10-CM | POA: Insufficient documentation

## 2016-11-26 DIAGNOSIS — E669 Obesity, unspecified: Secondary | ICD-10-CM | POA: Insufficient documentation

## 2016-11-26 DIAGNOSIS — K5909 Other constipation: Secondary | ICD-10-CM

## 2016-11-26 MED ORDER — OMEPRAZOLE 20 MG PO CPDR: 20.0000 mg | DELAYED_RELEASE_CAPSULE | Freq: Every day | ORAL | 3 refills | Status: DC

## 2016-11-26 MED ORDER — POLYETHYLENE GLYCOL 3350 17 GM/SCOOP PO POWD: 17.0000 g | Freq: Every day | ORAL | 1 refills | Status: DC

## 2016-11-26 NOTE — Progress Notes (Signed)
Subjective:  Patient ID: Darren Burns, male    DOB: [birth year]  Age: 41 y.o. MRN: [id number]  CC: Gastroesophageal Reflux   HPI Darren Burns is a 41 year old obese with a history of GERD who presents today for a follow-up visit.  He complains of left upper quadrant pain which he describes as burning and associated with eating; he has not had difficulty burping or passing gas and sometimes does have some relief when he applies pressure to his left upper quadrant with resulting burping. He endorses very small bowel movements with his last bowel movement three hours ago however  bowel movements are more when he ingests milk. I had placed him on omeprazole at his last office visit which he has not been taking.  His diet consists of a lot of pizza, widespread and he does not ingess lots of food with fiber. He has been working to lose weight and has modified his diet with resulting weight loss.  Past Medical History:  Diagnosis Date  . GERD (gastroesophageal reflux disease) 08/17/2016    No past surgical history on file.   Allergies  Allergen Reactions  . Ibuprofen Swelling and Other (See Comments)    intolerance  . Tamiflu [Oseltamivir Phosphate] Nausea And Vomiting     Outpatient Medications Prior to Visit  Medication Sig Dispense Refill  . acetaminophen (TYLENOL) 325 MG tablet Take 2 tablets (650 mg total) by mouth every 6 (six) hours as needed for mild pain or headache. (Patient not taking: Reported on 11/26/2016)    . omeprazole (PRILOSEC) 20 MG capsule Take 1 capsule (20 mg total) by mouth daily. (Patient not taking: Reported on 11/26/2016) 30 capsule 3   No facility-administered medications prior to visit.     ROS Review of Systems  Constitutional: Negative for activity change and appetite change.  HENT: Negative for sinus pressure and sore throat.   Eyes: Negative for visual disturbance.  Respiratory: Negative for cough, chest tightness and shortness of  breath.   Cardiovascular: Negative for chest pain and leg swelling.  Gastrointestinal: Positive for abdominal pain. Negative for abdominal distention, constipation and diarrhea.  Endocrine: Negative.   Genitourinary: Negative for dysuria.  Musculoskeletal: Negative for joint swelling and myalgias.  Skin: Negative for rash.  Allergic/Immunologic: Negative.   Neurological: Negative for weakness, light-headedness and numbness.  Psychiatric/Behavioral: Negative for dysphoric mood and suicidal ideas.    Objective:  BP 127/79   Pulse 68   Temp 98.1 F (36.7 C) (Oral)   Ht 5' 5"  (1.651 m)   Wt 188 lb 3.2 oz (85.4 kg)   SpO2 99%   BMI 31.32 kg/m   BP/Weight 11/26/2016 06/17/2021 04/19/3566  Systolic BP 616 837 290  Diastolic BP 79 99 90  Wt. (Lbs) 188.2 192 196  BMI 31.32 31.95 32.62      Physical Exam  Constitutional: He is oriented to person, place, and time. He appears well-developed and well-nourished.  Cardiovascular: Normal rate, normal heart sounds and intact distal pulses.   No murmur heard. Pulmonary/Chest: Effort normal and breath sounds normal. He has no wheezes. He has no rales. He exhibits no tenderness.  Abdominal: Soft. Bowel sounds are normal. He exhibits no distension and no mass. There is no tenderness.  Musculoskeletal: Normal range of motion.  Neurological: He is alert and oriented to person, place, and time.  Skin: Skin is warm and dry.  Psychiatric: He has a normal mood and affect.     Assessment & Plan:   1. Gastroesophageal reflux  disease without esophagitis Uncontrolled  Due to a PPI Emphasize compliance, avoiding late meals - omeprazole (PRILOSEC) 20 MG capsule; Take 1 capsule (20 mg total) by mouth daily.  Dispense: 30 capsule; Refill: 3  2. Other constipation Could explain his abdominal pain Discussed dietary modifications to increase fiber intake, water - polyethylene glycol powder (GLYCOLAX/MIRALAX) powder; Take 17 g by mouth daily.  Dispense:  3350 g; Refill: 1  3. Screening for metabolic disorder - AQL73+PVGK; Future - Lipid panel; Future   Meds ordered this encounter  Medications  . omeprazole (PRILOSEC) 20 MG capsule    Sig: Take 1 capsule (20 mg total) by mouth daily.    Dispense:  30 capsule    Refill:  3  . polyethylene glycol powder (GLYCOLAX/MIRALAX) powder    Sig: Take 17 g by mouth daily.    Dispense:  3350 g    Refill:  1    Follow-up: Return for Follow-up of GERD, keep previously scheduled appointment.Arnoldo Morale MD

## 2016-11-26 NOTE — Patient Instructions (Signed)

## 2016-11-30 ENCOUNTER — Ambulatory Visit: Payer: Self-pay | Attending: Family Medicine

## 2016-11-30 DIAGNOSIS — Z13228 Encounter for screening for other metabolic disorders: Secondary | ICD-10-CM | POA: Insufficient documentation

## 2016-11-30 NOTE — Progress Notes (Signed)
Patient here for lab visit only 

## 2016-12-01 LAB — CMP14+EGFR
ALBUMIN: 4.2 g/dL (ref 3.5–5.5)
ALK PHOS: 93 IU/L (ref 39–117)
ALT: 33 IU/L (ref 0–44)
AST: 24 IU/L (ref 0–40)
Albumin/Globulin Ratio: 1.6 (ref 1.2–2.2)
BUN / CREAT RATIO: 13 (ref 9–20)
BUN: 12 mg/dL (ref 6–24)
Bilirubin Total: 0.4 mg/dL (ref 0.0–1.2)
CALCIUM: 9.2 mg/dL (ref 8.7–10.2)
CO2: 26 mmol/L (ref 20–29)
CREATININE: 0.92 mg/dL (ref 0.76–1.27)
Chloride: 98 mmol/L (ref 96–106)
GFR, EST AFRICAN AMERICAN: 117 mL/min/{1.73_m2} (ref >59)
GFR, EST NON AFRICAN AMERICAN: 102 mL/min/{1.73_m2} (ref >59)
GLOBULIN, TOTAL: 2.6 g/dL (ref 1.5–4.5)
Glucose: 86 mg/dL (ref 65–99)
Potassium: 4.5 mmol/L (ref 3.5–5.2)
SODIUM: 137 mmol/L (ref 134–144)
TOTAL PROTEIN: 6.8 g/dL (ref 6.0–8.5)

## 2016-12-01 LAB — LIPID PANEL
CHOL/HDL RATIO: 4.5 ratio (ref 0.0–5.0)
Cholesterol, Total: 166 mg/dL (ref 100–199)
HDL: 37 mg/dL — ABNORMAL LOW (ref >39)
LDL CALC: 109 mg/dL — AB (ref 0–99)
Triglycerides: 99 mg/dL (ref 0–149)
VLDL Cholesterol Cal: 20 mg/dL (ref 5–40)

## 2016-12-06 ENCOUNTER — Telehealth: Payer: Self-pay

## 2016-12-06 NOTE — Telephone Encounter (Signed)
Pt was called and informed of lab results. 

## 2016-12-31 ENCOUNTER — Ambulatory Visit: Payer: Self-pay

## 2017-01-19 ENCOUNTER — Encounter: Payer: Self-pay | Admitting: Family Medicine

## 2017-02-23 ENCOUNTER — Encounter: Payer: Self-pay | Admitting: Family Medicine

## 2017-04-04 ENCOUNTER — Ambulatory Visit: Payer: Self-pay | Attending: Family Medicine

## 2017-04-14 ENCOUNTER — Ambulatory Visit: Payer: Self-pay | Admitting: Licensed Clinical Social Worker

## 2017-04-14 ENCOUNTER — Other Ambulatory Visit: Payer: Self-pay

## 2017-04-14 ENCOUNTER — Ambulatory Visit: Payer: Self-pay | Attending: Family Medicine | Admitting: Physician Assistant

## 2017-04-14 VITALS — BP 137/93 | HR 87 | Temp 98.4°F | Resp 16 | Ht 65.0 in | Wt 190.2 lb

## 2017-04-14 DIAGNOSIS — Z886 Allergy status to analgesic agent status: Secondary | ICD-10-CM | POA: Insufficient documentation

## 2017-04-14 DIAGNOSIS — F418 Other specified anxiety disorders: Secondary | ICD-10-CM

## 2017-04-14 DIAGNOSIS — N503 Cyst of epididymis: Secondary | ICD-10-CM

## 2017-04-14 DIAGNOSIS — R0602 Shortness of breath: Secondary | ICD-10-CM | POA: Insufficient documentation

## 2017-04-14 DIAGNOSIS — Z7982 Long term (current) use of aspirin: Secondary | ICD-10-CM | POA: Insufficient documentation

## 2017-04-14 DIAGNOSIS — F419 Anxiety disorder, unspecified: Secondary | ICD-10-CM | POA: Insufficient documentation

## 2017-04-14 DIAGNOSIS — K219 Gastro-esophageal reflux disease without esophagitis: Secondary | ICD-10-CM | POA: Insufficient documentation

## 2017-04-14 DIAGNOSIS — Z79899 Other long term (current) drug therapy: Secondary | ICD-10-CM | POA: Insufficient documentation

## 2017-04-14 DIAGNOSIS — R350 Frequency of micturition: Secondary | ICD-10-CM

## 2017-04-14 DIAGNOSIS — R079 Chest pain, unspecified: Secondary | ICD-10-CM | POA: Insufficient documentation

## 2017-04-14 LAB — POCT URINALYSIS DIPSTICK
Bilirubin, UA: NEGATIVE
Blood, UA: NEGATIVE
Glucose, UA: NEGATIVE
Ketones, UA: NEGATIVE
Leukocytes, UA: NEGATIVE
Nitrite, UA: NEGATIVE
PH UA: 5.5 (ref 5.0–8.0)
PROTEIN UA: NEGATIVE
Spec Grav, UA: 1.025 (ref 1.010–1.025)
UROBILINOGEN UA: 0.2 U/dL

## 2017-04-14 MED ORDER — ASPIRIN EC 81 MG PO TBEC: DELAYED_RELEASE_TABLET | ORAL | 0 refills | Status: DC

## 2017-04-14 NOTE — Progress Notes (Signed)
Patient stated he's been under stress and tiredness.  Having hard time breathing, little sharp pain on his chest, feeling of falling down when working.  Patient have two small bumps on his testicle.

## 2017-04-14 NOTE — Patient Instructions (Addendum)
Dolor de pecho inespecfico (Nonspecific Chest Pain) El dolor de pecho puede deberse a muchas enfermedades diferentes. Siempre existe una posibilidad de que el dolor est relacionado con algo grave, como un infarto de miocardio o un cogulo sanguneo en los pulmones. Hay muchas enfermedades que no son potencialmente mortales que pueden causar dolor de [place]. Si tiene Social research officer, government de Engineer, building services, es muy importante que se controle con el mdico. CAUSAS Las causas del dolor de pecho pueden ser las siguientes:  Acidez estomacal.  Neumona o bronquitis.  Ansiedad o estrs.  Inflamacin de la zona que rodea al corazn (pericarditis) o a los pulmones (pleuritis o pleuresa).  Un cogulo sanguneo en el pulmn.  Colapso de un pulmn (neumotrax), que puede aparecer de [employer] repentina por s solo (neumotrax espontneo) o debido a un traumatismo en el trax.  Culebrilla (virus de la varicela zster).  Infarto de miocardio.  Dao de los [place], los msculos y los cartlagos que conforman la pared torcica. Esto puede incluir lo siguiente: ? Hematomas seos debido a lesiones. ? Distensiones musculares o de los cartlagos por tos frecuente o repetida, o por exceso de trabajo. ? Fractura de una o ms costillas. ? Dolor de Database administrator debido a inflamacin (costocondritis). FACTORES DE RIESGO Los factores de riesgo de tener dolor de pecho pueden incluir lo siguiente:  Actividades que incrementan el riesgo de sufrir traumatismos o lesiones en el trax.  Infecciones o enfermedades respiratorias que causan tos frecuente.  Enfermedades o [employer] comidas que pueden causar Geographical information systems officer.  Enfermedades cardacas o antecedentes familiares de enfermedades cardacas.  Enfermedades o comportamientos de salud que aumentan el riesgo de tener un cogulo sanguneo.  Haber tenido varicela (varicela zster). SIGNOS Y SNTOMAS El dolor de pecho puede provocar las siguientes sensaciones:  Ardor u hormigueo en la  superficie o en lo profundo del pecho.  Dolor opresivo, continuo o constrictivo.  Dolor vago o intenso que empeora al [employer], toser o inhalar profundamente.  Dolor que tambin se siente en la espalda, el cuello, el hombro o el brazo, o dolor que se irradia a cualquiera de estas zonas. El dolor de pecho puede aparecer y Armed forces operational officer, o bien puede ser constante. DIAGNSTICO Ileene Hutchinson se necesiten anlisis de laboratorio u otros estudios para Animator causa del Social research officer, government. El mdico puede indicarle que se haga una prueba llamada EGC (electrocadiograma) ambulatorio. El Radio broadcast assistant los patrones de los latidos cardacos en el momento en que se realiza el [place]. Tambin pueden hacerle otros estudios, por ejemplo:  Ecocardiograma transtorcico (ETT). Durante el ecocardiograma, se usan ondas sonoras para crear una imagen de todas las estructuras cardacas y evaluar cmo circula la sangre por el corazn.  Ecocardiograma transesofgico (ETE).Este es un estudio de diagnstico por imgenes ms avanzado que el obtiene imgenes del interior del cuerpo. Le permite al mdico ver el corazn con mayor detalle.  Monitoreo cardaco. Permite que el mdico controle la frecuencia y el ritmo cardaco en tiempo real.  Monitor Holter. Es un dispositivo porttil que [employer] latidos del corazn y puede ayudar a Retail buyer las arritmias cardacas. Le permite al [employer] registrar la actividad [place], si es necesario.  Pruebas de esfuerzo. Estas pueden realizarse durante el ejercicio o mediante la administracin de un medicamento que acelera los latidos del corazn.  Anlisis de [place].  Diagnstico por imgenes. TRATAMIENTO El tratamiento depende de la causa del dolor de [place]. El tratamiento puede incluir lo siguiente:  Medicamentos. Estos pueden incluir lo siguiente: ? Inhibidores de Best boy  estomacal. ? Antiinflamatorios. ? Analgsicos para las enfermedades  inflamatorias. ? Antibiticos, si hay una infeccin. ? Medicamentos para [employer] ? Medicamentos para tratar la enfermedad arterial coronaria.  Tratamiento complementario para las enfermedades que no requieren la toma de medicamentos. Esto puede incluir lo siguiente: ? Descansar. ? Aplicar compresas fras o calientes en las zonas lesionadas. ? Limitar las actividades hasta que [employer]ident. INSTRUCCIONES PARA EL CUIDADO EN EL HOGAR  Si le recetaron antibiticos, asegrese de terminarlos, incluso si comienza a sentirse mejor.  Evite las [employer] causen dolor de [place].  No consuma ningn producto que contenga tabaco, lo que incluye cigarrillos, tabaco de Higher education careers adviser o Psychologist, sport and exercise. Si necesita ayuda para dejar de fumar, consulte al mdico.  No beba alcohol.  Tome los medicamentos solamente como se lo haya indicado el mdico.  Concurra a todas las visitas de control como se lo haya indicado el mdico. Esto es importante. Esto incluye otros estudios si el dolor de pecho no desaparece.  Si la acidez es la causa del dolor de [place], tal vez le aconsejen que mantenga la cabeza levantada (elevada) mientras duerme. Esto reduce la probabilidad de que el cido retroceda del estmago al esfago.  Haga cambios en su estilo de vida como se lo haya indicado el mdico. Estos pueden incluir lo siguiente: ? Practicar actividad fsica con regularidad. Pida al mdico que le sugiera algunas actividades que sean seguras para usted. ? Consumir una dieta cardiosaludable. Un nutricionista matriculado puede ayudarlo a Adult nurse saludables. ? Mantener un peso saludable. ? Controlar la diabetes, si es necesario. ? Reducir las situaciones de estrs.  SOLICITE ATENCIN MDICA SI:  El dolor de pecho no desaparece despus del tratamiento.  Tiene una erupcin cutnea con ampollas en el pecho.  Tiene fiebre.  SOLICITE ATENCIN MDICA DE INMEDIATO SI:  El  dolor en el pecho es ms intenso.  La tos empeora, o expectora sangre.  Siente un dolor abdominal intenso.  Siente debilidad intensa.  Se desmaya.  Tiene escalofros.  Tiene una molestia repentina e inexplicable en el pecho.  Tiene molestias repentinas e [employer], la espalda, el cuello o la [place].  Le falta el aire en cualquier momento.  Comienza a sudar de [place] repentina o la piel se le humedece.  Siente nuseas o vomita.  Se siente repentinamente mareado o se desmaya.  Siente que el corazn comienza a latir rpidamente o que se saltea latidos. Estos sntomas pueden representar un problema grave que constituye Engineer, maintenance (IT). No espere hasta que los sntomas desaparezcan. Solicite atencin mdica de inmediato. Comunquese con el servicio de emergencias de su localidad (911 en los [place]). No conduzca por sus propios medios Principal Financial. Esta informacin no tiene Marine scientist el consejo del mdico. Asegrese de hacerle al mdico cualquier pregunta que tenga. Document Released: 02/01/2005 Document Revised: 02/22/2014 Document Reviewed: 08/11/2015 Elsevier Interactive Patient Education  2017 [place] con ansiedad Living With Anxiety Despus de haber sido diagnosticado con trastorno de ansiedad, podra sentirse aliviado por comprender por qu se haba sentido o haba actuado de cierto modo. Adems, es natural sentirse abrumado por el tratamiento que tiene por delante y por lo que este significar para su vida. Con atencin y [place], [place] trastorno y Marine scientist. Cmo hacer frente a la ansiedad Enfrentar el estrs El estrs es la reaccin del cuerpo ante los cambios y los acontecimientos de la vida, tanto buenos [place]. El  estrs puede durar solo algunas horas o puede ser [place]. El estrs puede influir mucho en la ansiedad, por lo que es importante aprender sobre cmo hacerle frente y cmo pensarlo de un  modo nuevo. Hable con el mdico o un orientador psicolgico para obtener ms informacin sobre cmo Software engineer. Podran sugerirle algunas tcnicas para hacerlo, como:  Musicoterapia. Esto podra incluir crear o escuchar msica que disfrute y lo inspire.  Meditacin consciente. Esto implica prestar atencin a la respiracin normal ms que intentar controlarla. Puede realizarse mientras est sentado o camina.  Oracin centrante. Este es un tipo de meditacin que implica centrarse en una palabra, frase o imagen sagrada que le sea representativa y le genere paz.  Respiracin profunda. Para hacer esto, expanda el estmago e inhale lentamente por la nariz. Mantenga el aire durante un lapso de [place]. Luego, exhale lentamente mientras deja que los msculos del estmago se relajen.  Dilogo interno. Se trata de una habilidad por la que usted es capaz de identificar patrones de pensamiento que lo llevan a Best boy reacciones ansiosas y de corregir dichos pensamientos.  Relajacin muscular. Esto implica tensar los msculos y, [place], [place].  Elija una tcnica para reducir el estrs que se adapte a su estilo de vida y su personalidad. Las tcnicas para reducir el estrs llevan tiempo y Location manager. Resrvese de [id number] por da para Ambulance person. Algunos terapeutas pueden ofrecerle capacitacin para aprenderlas. Es posible que algunos planes de seguro mdico cubran la capacitacin. Otras cosas que puede hacer para manejar el estrs:  Catering manager un registro del estrs. Esto puede ayudarlo a Financial planner estrs y modos de Chief Technology Officer su reaccin.  Pensar en cmo reacciona ante ciertas situaciones. Es posible que no sea capaz de Chief Technology Officer todo, pero puede controlar su reaccin.  Hacerse tiempo para las actividades que lo ayudan a Nurse, children's y no sentir culpa por pasar su tiempo de [place].  La terapia en combinacin con las habilidades para enfrentar y reducir el estrs  proporciona la mejor alternativa para un tratamiento satisfactorio. Medicamentos Los medicamentos pueden ayudar a [employer]. [employer]. Algunos medicamentos para la ansiedad:  Teacher, adult education ansiedad.  Antidepresivos.  Betabloqueantes.  Es posible que se requieran medicamentos, junto con la terapia, si otros tratamientos no dieron [place]. Un mdico debe recetar los medicamentos. [employer] interpersonales pueden ser muy importantes para ayudar a su recuperacin. Intente pasar ms tiempo interactuando con amigos y familiares de [place]. Considere la posibilidad de ir a terapia de pareja, tomar clases de educacin familiar o ir a Careers information officer. La terapia puede ayudarlos a usted y a los dems a comprender mejor el trastorno. Cmo reconocer cambios en el trastorno Todos tienen una respuesta diferente al tratamiento de la ansiedad. Se dice que est recuperado de la ansiedad cuando los sntomas disminuyen y dejan de Cabin crew en las actividades diarias en el hogar o [place]. Esto podra significar que usted comenzar a Field seismologist lo siguiente:  Associate Professor y atencin.  Dormir mejor.  Estar menos irritable.  Tener ms energa.  Tener [employer].  Es Glass blower/designer cundo el trastorno [place]. Comunquese con el mdico si sus sntomas interfieren en su hogar o su trabajo, y usted no siente que el trastorno est mejorando. Dnde encontrar ayuda y 63: Puede conseguir ayuda y [employer] siguientes lugares:  Grupos de [place].  Organizaciones comunitarias y en lnea.  Un lder espiritual de confianza.  Terapia de pareja.  Clases de educacin  familiar.  Terapia familiar.  Siga estas instrucciones en su casa:  Consuma una dieta saludable que incluya abundantes frutas, verduras, cereales integrales, productos lcteos descremados y protenas magras. No consuma muchos alimentos con alto contenido de grasas slidas, azcares  agregados o sal.  Actividad fsica. La [employer] de los adultos debe hacer lo siguiente: ? Optometrist, al [place], 156minutos de actividad fsica por semana. El ejercicio debe aumentar la frecuencia cardaca y Nature conservation officer transpirar (ejercicio de intensidad moderada). ? Realizar ejercicios de fortalecimiento por lo [employer] por semana.  Disminuir el consumo de cafena, tabaco, alcohol y otras sustancias potencialmente dainas.  Dormir el tiempo adecuado y de Cabin crew. La [employer] Farm de los adultos necesitan entre [id number] de sueo todas las noches.  Opte por cosas que le simplifiquen la vida.  Tome los medicamentos de venta libre y los recetados solamente como se lo haya indicado el mdico.  Evite el consumo de cafena, alcohol y ciertos medicamentos contra el resfro de venta sin receta. Estos podran Engineer, building services. Pregntele al farmacutico qu medicamentos no debera tomar.  Concurra a todas las visitas de control como se lo haya indicado el mdico. Esto es importante. Preguntas para hacerle al mdico  Me ser til la terapia?  Con qu frecuencia debo visitar a un mdico para el seguimiento?  Durante cunto tiempo tendr Liberty Global?  Tienen efectos secundarios a American Standard Companies tomo?  Existe una alternativa que remplace los medicamentos? Comunquese con un mdico si:  Le resulta difcil permanecer concentrado o finalizar las tareas diarias.  Pasa muchas horas por da sintindose preocupado por la vida cotidiana.  La preocupacin le provoca un cansancio extremo.  Comienza a tener dolores de [place] o nuseas, o a sentirse tenso.  Orina ms de lo normal.  Tiene diarrea. Solicite ayuda de inmediato si:  Se le acelera la frecuencia cardaca y Industrial/product designer.  Tiene pensamientos acerca de Glass blower/designer o daar a Producer, television/film/video. Si alguna vez siente que puede lastimarse o Market researcher a los dems, o tiene pensamientos de poner  fin a su vida, busque ayuda de inmediato. Puede dirigirse al servicio de urgencias ms cercano o comunicarse con:  El servicio de Multimedia programmer de su localidad (911 en los [place]).  Una lnea de asistencia al suicida y Freight forwarder en crisis, como la [employer] Corporation de Prevencin del Suicidio (National Suicide Prevention Lifeline) al [phone]. Est disponible las 24 horas del da.  Resumen  Tomar medidas para enfrentar el estrs puede calmarlo.  Los medicamentos no pueden curar los trastornos de [place], Armed forces training and education officer pueden ayudar a E. I. du Pont.  Los familiares, los amigos y las parejas pueden tener un lugar importante en su recuperacin del trastorno de ansiedad. Esta informacin no tiene Marine scientist el consejo del mdico. Asegrese de hacerle al mdico cualquier pregunta que tenga. Document Released: 05/11/2016 Document Revised: 05/11/2016 Document Reviewed: 05/11/2016 Elsevier Interactive Patient Education  [employer].

## 2017-04-14 NOTE — Progress Notes (Signed)
Patient ID: Darren Burns, male   DOB: 20-Oct-1975, 42 y.o.   MRN: [id number]     Darren Burns, is a 42 y.o. male  UDJ:[id number]  [id number]  DOB - 1975/07/14  Subjective:  Chief Complaint and HPI: Darren Burns is a 42 y.o. male here today Feeling stressed and pressure at home especially.  C/o some chest pressure and tightness along with SOB.  This has been going on for about 5 days.  Can last several minutes to all day.  Seems to be worse when he is at home.  No paresthesias or radiating pain.  No diaphoresis.  Can occur at rest and with activity.    No FH cardiac issues at young ages.  Social:  Works for [employer], some silica and acid exposure    Also, cyst on testicle and was supposed to get a referral but his orange card had expired.  Hi OC is UTD now.  He said originally he had 1 small cyst on the testicle and now he has 2.  They are superficial and not causing pain.  He will not allow me to examine his genitalia today because he is sweaty from work.  Also c/o urinary frequency without dysuria.  This has been occurring for a couple of weeks.  No f/c or flank pain.  Denies STI risk factors/monogamous with wife.     ROS:   Constitutional:  No f/c, No night sweats, No unexplained weight loss. EENT:  No vision changes, No blurry vision, No hearing changes. No mouth, throat, or ear problems.  Respiratory: No cough, + SOB Cardiac: + CP, no palpitations GI:  No abd pain, No N/V/D. GU: + Urinary frequency Musculoskeletal: No joint pain Neuro: No headache, no dizziness, no motor weakness.  Skin: No rash Endocrine:  No polydipsia. No polyuria.  Psych: Denies SI/HI  No problems updated.  ALLERGIES: Allergies  Allergen Reactions  . Ibuprofen Swelling and Other (See Comments)    intolerance  . Tamiflu [Oseltamivir Phosphate] Nausea And Vomiting    PAST MEDICAL HISTORY: Past Medical History:  Diagnosis Date  . GERD (gastroesophageal reflux disease)  08/17/2016    MEDICATIONS AT HOME: Prior to Admission medications   Medication Sig Start Date End Date Taking? Authorizing Provider  acetaminophen (TYLENOL) 325 MG tablet Take 2 tablets (650 mg total) by mouth every 6 (six) hours as needed for mild pain or headache. Patient not taking: Reported on 11/26/2016 11/10/14   Domenic Polite, MD  aspirin EC 81 MG tablet Take 1 tablet Monday, Wednesday, and friday 04/14/17   Darren Burns M, PA-C  omeprazole (PRILOSEC) 20 MG capsule Take 1 capsule (20 mg total) by mouth daily. Patient not taking: Reported on 04/14/2017 11/26/16   Charlott Rakes, MD  polyethylene glycol powder (GLYCOLAX/MIRALAX) powder Take 17 g by mouth daily. Patient not taking: Reported on 04/14/2017 11/26/16   Charlott Rakes, MD     Objective:  EXAM:   Vitals:   04/14/17 1603  BP: (!) 137/93  Pulse: 87  Resp: 16  Temp: 98.4 F (36.9 C)  TempSrc: Oral  SpO2: 99%  Weight: 190 lb 3.2 oz (86.3 kg)  Height: 5\' 5"  (1.651 m)    General appearance : A&OX3. NAD. Non-toxic-appearing HEENT: Atraumatic and Normocephalic.  PERRLA. EOM intact.   Neck: supple, no JVD. No cervical lymphadenopathy. No thyromegaly Chest/Lungs:  Breathing-non-labored, Good air entry bilaterally, breath sounds normal without rales, rhonchi, or wheezing  CVS: S1 S2 regular, no murmurs, gallops, rubs  Extremities: Bilateral Lower  Ext shows no edema, both legs are warm to touch with = pulse throughout Neurology:  CN II-XII grossly intact, Non focal.   Psych:  TP linear. J/I WNL. Normal speech. Appropriate eye contact and affect.  Skin:  No Rash  Data Review No results found for: HGBA1C   EKG:  NSR, no ST changes   Assessment & Plan   1. Chest pain, unspecified type Doubt cardiac; likely stress/anxiety related.  EKG with no acute changes.  Call 911 if any worsening of CP.  Patient agrees.   - Comprehensive metabolic panel - TSH - EKG 12-Lead - aspirin EC 81 MG tablet; Take 1 tablet Monday,  Wednesday, and friday  Dispense: 100 tablet; Refill: 0.  Although he has ibuprofen listed as an allergy, he takes aspirin occasionally w/o any problems  2. Anxiety I had him meet with the social worker today, Christa See.  There are no acute safety issues.  I did talk with him about self-care, deep breathing techniques, proper diet and exercise in addition to water intake.  Limit caffeine intake to 2 cups of coffee per day or less.    3. Epididymal cyst No exam today/patient refused - Ambulatory referral to Urology  4. Urinary frequency UA WNL today - Urinalysis Dipstick  Spent 42mins face to face with patient with more than 50% of the time counseling on the above.     Return in about 3 months (around 07/12/2017) for Dr Margarita Rana for check up.  The patient was given clear instructions to go to ER or return to medical center if symptoms don't improve, worsen or new problems develop. The patient verbalized understanding. The patient was told to call to get lab results if they haven't heard anything in the next week.     Darren Caldron, PA-C [facility] and [place], [place]   04/14/2017, 4:53 PM

## 2017-04-14 NOTE — BH Specialist Note (Signed)
Integrated Behavioral Health Initial Visit  MRN: [id number] Name: Mitchael Luckey  Number of [place] Clinician visits:: 1/6 Session Start time: 4:55 PM  Session End time: 5:10 PM Total time: 15 minutes  Type of Service: [place] Interpretor:No. Interpretor Name and Language: N/A   Warm Hand Off Completed.       SUBJECTIVE: Abelardo Seidner is a 42 y.o. male accompanied by self Patient was referred by Weyman Pedro for anxiety and depression. Patient reports the following symptoms/concerns: feelings of sadness and worry, difficulty sleeping, low energy, decreased concentration, and irritability Duration of problem: Couple of months; Severity of problem: mild  OBJECTIVE: Mood: Anxious and Pleasant and Affect: Appropriate Risk of harm to self or others: No plan to harm self or others  LIFE CONTEXT: Family and Social: Pt receives support from spouse and children School/Work: Pt is employed and receives food stamps Self-Care: Pt denies substance use Life Changes: Pt experiencing increased stress due to financial strain.  GOALS ADDRESSED: Patient will: 1. Reduce symptoms of: anxiety and stress 2. Increase knowledge and/or ability of: coping skills and stress reduction  3. Demonstrate ability to: Increase adequate support systems for patient/family  INTERVENTIONS: Interventions utilized: Solution-Focused Strategies, Mindfulness or Psychologist, educational, Psychoeducation and/or Health Education and Link to [employer]  Standardized Assessments completed: GAD-7 and PHQ 2&9  ASSESSMENT: Patient currently experiencing mild depression and anxiety triggered by financial strain. He reports feelings of sadness and worry, difficulty sleeping, low energy, decreased concentration, and irritability.    Patient may benefit from psychoeducation and psychotherapy. [place] educated pt on correlation between one's physical and  mental health, in addition, to how stress can negatively impact both. LCSWA discussed therapeutic interventions to assist pt in decreasing symptoms and provided community resources to assist with utility/rent assistance.   PLAN: 1. Follow up with behavioral health clinician on : Pt was encouraged to contact LCSWA if symptoms worsen or fail to improve to schedule behavioral appointments at [facility]. 2. Behavioral recommendations: LCSWA recommends that pt apply healthy coping skills discussed and utilize provided resources. Pt is encouraged to schedule follow up appointment with LCSWA 3. Referral(s): [place] (In Clinic) and [employer] Resources:  Finances 4. "From scale of 1-10, how likely are you to follow plan?": 8/10  Rebekah Chesterfield, LCSW 04/18/17 5:10 PM

## 2017-07-12 ENCOUNTER — Ambulatory Visit: Payer: Self-pay | Admitting: Family Medicine

## 2017-07-16 DIAGNOSIS — Z8619 Personal history of other infectious and parasitic diseases: Secondary | ICD-10-CM

## 2017-07-16 HISTORY — DX: Personal history of other infectious and parasitic diseases: Z86.19

## 2017-08-04 ENCOUNTER — Ambulatory Visit: Payer: Self-pay | Attending: Family Medicine | Admitting: Physician Assistant

## 2017-08-04 ENCOUNTER — Other Ambulatory Visit: Payer: Self-pay

## 2017-08-04 VITALS — BP 128/80 | HR 92 | Temp 98.5°F | Resp 18 | Ht 65.0 in | Wt 201.0 lb

## 2017-08-04 DIAGNOSIS — Z7982 Long term (current) use of aspirin: Secondary | ICD-10-CM | POA: Insufficient documentation

## 2017-08-04 DIAGNOSIS — K219 Gastro-esophageal reflux disease without esophagitis: Secondary | ICD-10-CM | POA: Insufficient documentation

## 2017-08-04 DIAGNOSIS — N503 Cyst of epididymis: Secondary | ICD-10-CM

## 2017-08-04 DIAGNOSIS — R350 Frequency of micturition: Secondary | ICD-10-CM | POA: Insufficient documentation

## 2017-08-04 DIAGNOSIS — R079 Chest pain, unspecified: Secondary | ICD-10-CM | POA: Insufficient documentation

## 2017-08-04 DIAGNOSIS — R0602 Shortness of breath: Secondary | ICD-10-CM | POA: Insufficient documentation

## 2017-08-04 DIAGNOSIS — Z886 Allergy status to analgesic agent status: Secondary | ICD-10-CM | POA: Insufficient documentation

## 2017-08-04 DIAGNOSIS — Z79899 Other long term (current) drug therapy: Secondary | ICD-10-CM | POA: Insufficient documentation

## 2017-08-04 LAB — POCT URINALYSIS DIPSTICK
Bilirubin, UA: NEGATIVE
GLUCOSE UA: NEGATIVE
Ketones, UA: NEGATIVE
LEUKOCYTES UA: NEGATIVE
NITRITE UA: NEGATIVE
PH UA: 6 (ref 5.0–8.0)
PROTEIN UA: NEGATIVE
RBC UA: NEGATIVE
SPEC GRAV UA: 1.015 (ref 1.010–1.025)
UROBILINOGEN UA: 0.2 U/dL

## 2017-08-04 MED ORDER — OMEPRAZOLE 20 MG PO CPDR: 20.0000 mg | DELAYED_RELEASE_CAPSULE | Freq: Every day | ORAL | 3 refills | Status: DC

## 2017-08-04 NOTE — Patient Instructions (Signed)
Heartburn Heartburn is a type of pain or discomfort that can happen in the throat or chest. It is often described as a burning pain. It may also cause a bad taste in the mouth. Heartburn may feel worse when you lie down or bend over. It may be caused by stomach contents that move back up (reflux) into the tube that connects the mouth with the stomach (esophagus). Follow these instructions at home: Take these actions to lessen your discomfort and to help avoid problems. Diet  Follow a diet as told by your doctor. You may need to avoid foods and drinks such as: ? Coffee and tea (with or without caffeine). ? Drinks that contain alcohol. ? Energy drinks and sports drinks. ? Carbonated drinks or sodas. ? Chocolate and cocoa. ? Peppermint and mint flavorings. ? Garlic and onions. ? Horseradish. ? Spicy and acidic foods, such as peppers, chili powder, curry powder, vinegar, hot sauces, and BBQ sauce. ? Citrus fruit juices and citrus fruits, such as oranges, lemons, and limes. ? Tomato-based foods, such as red sauce, chili, salsa, and pizza with red sauce. ? Fried and fatty foods, such as donuts, french fries, potato chips, and high-fat dressings. ? High-fat meats, such as hot dogs, rib eye steak, sausage, ham, and bacon. ? High-fat dairy items, such as whole milk, butter, and cream cheese.  Eat small meals often. Avoid eating large meals.  Avoid drinking large amounts of liquid with your meals.  Avoid eating meals during the 2-3 hours before bedtime.  Avoid lying down right after you eat.  Do not exercise right after you eat. General instructions  Pay attention to any changes in your symptoms.  Take over-the-counter and prescription medicines only as told by your doctor. Do not take aspirin, ibuprofen, or other NSAIDs unless your doctor says it is okay.  Do not use any tobacco products, including cigarettes, chewing tobacco, and e-cigarettes. If you need help quitting, ask your  doctor.  Wear loose clothes. Do not wear anything tight around your waist.  Raise (elevate) the head of your bed about 6 inches (15 cm).  Try to lower your stress. If you need help doing this, ask your doctor.  If you are overweight, lose an amount of weight that is healthy for you. Ask your doctor about a safe weight loss goal.  Keep all follow-up visits as told by your doctor. This is important. Contact a doctor if:  You have new symptoms.  You lose weight and you do not know why it is happening.  You have trouble swallowing, or it hurts to swallow.  You have wheezing or a cough that keeps happening.  Your symptoms do not get better with treatment.  You have heartburn often for more than two weeks. Get help right away if:  You have pain in your arms, neck, jaw, teeth, or back.  You feel sweaty, dizzy, or light-headed.  You have chest pain or shortness of breath.  You throw up (vomit) and your throw up looks like blood or coffee grounds.  Your poop (stool) is bloody or black. This information is not intended to replace advice given to you by your health care provider. Make sure you discuss any questions you have with your health care provider. Document Released: 10/14/2010 Document Revised: 07/10/2015 Document Reviewed: 05/29/2014 Elsevier Interactive Patient Education  2018 Elsevier Inc.  

## 2017-08-04 NOTE — Progress Notes (Signed)
Patient ID: Darren Burns, male   DOB: 1975-07-06, 42 y.o.   MRN: [id number]     Darren Burns, is a 42 y.o. male  AST:[id number]  [id number]:[id number]  DOB - 05-19-1975  Subjective:  Chief Complaint and HPI: Darren Burns is a 42 y.o. male here for several concerns.  2 episodes of feeling weak and having CP that lasted about 1 hour.  These occurred about a week ago and both times occurred at the end of the work day.  He felt weak and light-headed.  He had mid chest pain that was moderate to severe and also felt SOB.  He denies radiating pain.  No FH early MI.  Non-smoker.  No diaphoresis.  No s/sx now  Also c/o reflux daily since stopping omeprazole.  Certain foods such as catsup cause him to cough.  Never made appt with urology but has referral information and the number to call from his last visit(he shows me the paperwork).    ROS:   Constitutional:  No f/c, No night sweats, No unexplained weight loss. EENT:  No vision changes, No blurry vision, No hearing changes. No mouth, throat, or ear problems.  Respiratory: No cough, + SOB Cardiac: + CP, no palpitations GI:  No abd pain, No N/V/D. + heartburn/reflux GU: No Urinary s/sx Musculoskeletal: No joint pain Neuro: No headache, no dizziness, no motor weakness.  Skin: No rash Endocrine:  No polydipsia. No polyuria.  Psych: Denies SI/HI  No problems updated.  ALLERGIES: Allergies  Allergen Reactions  . Ibuprofen Swelling and Other (See Comments)    intolerance  . Tamiflu [Oseltamivir Phosphate] Nausea And Vomiting    PAST MEDICAL HISTORY: Past Medical History:  Diagnosis Date  . GERD (gastroesophageal reflux disease) 08/17/2016    MEDICATIONS AT HOME: Prior to Admission medications   Medication Sig Start Date End Date Taking? Authorizing Provider  aspirin EC 81 MG tablet Take 1 tablet Monday, Wednesday, and friday 04/14/17  Yes McClung, Angela M, PA-C  acetaminophen (TYLENOL) 325 MG tablet Take 2 tablets (650  mg total) by mouth every 6 (six) hours as needed for mild pain or headache. Patient not taking: Reported on 11/26/2016 11/10/14   Domenic Polite, MD  omeprazole (PRILOSEC) 20 MG capsule Take 1 capsule (20 mg total) by mouth daily. 08/04/17   Argentina Donovan, PA-C  polyethylene glycol powder (GLYCOLAX/MIRALAX) powder Take 17 g by mouth daily. Patient not taking: Reported on 04/14/2017 11/26/16   Charlott Rakes, MD     Objective:  EXAM:   Vitals:   08/04/17 1344  BP: 128/80  Pulse: 92  Resp: 18  Temp: 98.5 F (36.9 C)  TempSrc: Oral  SpO2: 96%  Weight: 201 lb (91.2 kg)  Height: 5\' 5"  (1.651 m)    General appearance : A&OX3. NAD. Non-toxic-appearing HEENT: Atraumatic and Normocephalic.  PERRLA. EOM intact.  TM clear B. Mouth-MMM, post pharynx WNL w/o erythema, No PND. Neck: supple, no JVD. No cervical lymphadenopathy. No thyromegaly Chest/Lungs:  Breathing-non-labored, Good air entry bilaterally, breath sounds normal without rales, rhonchi, or wheezing  CVS: S1 S2 regular, no murmurs, gallops, rubs  Abdomen: Bowel sounds present, Non tender and not distended with no gaurding, rigidity or rebound. Extremities: Bilateral Lower Ext shows no edema, both legs are warm to touch with = pulse throughout Neurology:  CN II-XII grossly intact, Non focal.   Psych:  TP linear. J/I WNL. Normal speech. Appropriate eye contact and affect.  Skin:  No Rash  Data Review No results found for:  HGBA1C   Assessment & Plan   1. Chest pain, unspecified type Doubt cardiac.  Compared EKG from previously and no changes. CP warnings call 911.  Pain may be related to reflux.  Weight loss also recommended as he has gained some weight since his last visit.  - EKG 12-Lead - TSH - Comprehensive metabolic panel - Ambulatory referral to Cardiology  2. Gastroesophageal reflux disease without esophagitis resume - omeprazole (PRILOSEC) 20 MG capsule; Take 1 capsule (20 mg total) by mouth daily.  Dispense:  30 capsule; Refill: 3 - H. pylori breath test  3. Epididymal cyst Make urology appt  4. Urinary frequency - Urinalysis Dipstick UA normal today   Patient have been counseled extensively about nutrition and exercise  Return in about 1 month (around 09/03/2017) for Dr Margarita Rana; f/up heartburn and chest pain.  The patient was given clear instructions to go to ER or return to medical center if symptoms don't improve, worsen or new problems develop. The patient verbalized understanding. The patient was told to call to get lab results if they haven't heard anything in the next week.     Freeman Caldron, PA-C [facility] and [place], [place]   08/04/2017, 2:03 PM

## 2017-08-05 LAB — COMPREHENSIVE METABOLIC PANEL
A/G RATIO: 1.4 (ref 1.2–2.2)
ALBUMIN: 4.3 g/dL (ref 3.5–5.5)
ALT: 33 IU/L (ref 0–44)
AST: 23 IU/L (ref 0–40)
Alkaline Phosphatase: 101 IU/L (ref 39–117)
BUN / CREAT RATIO: 11 (ref 9–20)
BUN: 10 mg/dL (ref 6–24)
Bilirubin Total: 0.2 mg/dL (ref 0.0–1.2)
CALCIUM: 9.4 mg/dL (ref 8.7–10.2)
CO2: 25 mmol/L (ref 20–29)
CREATININE: 0.93 mg/dL (ref 0.76–1.27)
Chloride: 104 mmol/L (ref 96–106)
GFR calc Af Amer: 116 mL/min/{1.73_m2} (ref >59)
GFR, EST NON AFRICAN AMERICAN: 100 mL/min/{1.73_m2} (ref >59)
GLOBULIN, TOTAL: 3 g/dL (ref 1.5–4.5)
Glucose: 98 mg/dL (ref 65–99)
POTASSIUM: 4.2 mmol/L (ref 3.5–5.2)
SODIUM: 142 mmol/L (ref 134–144)
Total Protein: 7.3 g/dL (ref 6.0–8.5)

## 2017-08-05 LAB — TSH: TSH: 1.85 u[IU]/mL (ref 0.450–4.500)

## 2017-08-06 LAB — H. PYLORI BREATH TEST: H PYLORI BREATH TEST: POSITIVE — AB

## 2017-08-09 ENCOUNTER — Other Ambulatory Visit: Payer: Self-pay | Admitting: Physician Assistant

## 2017-08-09 DIAGNOSIS — K219 Gastro-esophageal reflux disease without esophagitis: Secondary | ICD-10-CM

## 2017-08-09 MED ORDER — AMOXICILLIN 500 MG PO CAPS: 1000.0000 mg | ORAL_CAPSULE | Freq: Two times a day (BID) | ORAL | 0 refills | Status: DC

## 2017-08-09 MED ORDER — OMEPRAZOLE 20 MG PO CPDR: 20.0000 mg | DELAYED_RELEASE_CAPSULE | Freq: Two times a day (BID) | ORAL | 3 refills | Status: DC

## 2017-08-09 MED ORDER — CLARITHROMYCIN 500 MG PO TABS: 500.0000 mg | ORAL_TABLET | Freq: Two times a day (BID) | ORAL | 0 refills | Status: DC

## 2017-08-11 ENCOUNTER — Telehealth: Payer: Self-pay | Admitting: *Deleted

## 2017-08-11 NOTE — Telephone Encounter (Signed)
-----   Message from Argentina Donovan, [place] sent at 08/09/2017  1:10 PM EDT ----- Please call patient.  He tested positive for the bacteria that causes stomach ulcers.  I sent him 3 prescriptions to our pharmacy for him to take for 2 weeks.  His thyroid test was normal. His kidney function, liver function, electrolytes, blood sugar were all normal.  Follow-up as planned. Thanks, Freeman Caldron, PA-C

## 2017-08-11 NOTE — Telephone Encounter (Signed)
Medical Assistant used [employer] to contact patient.  Interpreter Name: Rosemarie Ax Interpreter #: [id number] Patient verified DOB Patient is aware of h pylori being positive and needing to pick up the 3 mediations and complete the treatment over 2 weeks. Patient is also aware of thyroid, kidney, and liver function being normal.

## 2017-08-31 ENCOUNTER — Ambulatory Visit: Payer: Self-pay | Attending: Family Medicine | Admitting: Physician Assistant

## 2017-08-31 VITALS — BP 116/74 | HR 90 | Temp 98.9°F | Resp 18 | Ht 67.0 in | Wt 195.0 lb

## 2017-08-31 DIAGNOSIS — A048 Other specified bacterial intestinal infections: Secondary | ICD-10-CM

## 2017-08-31 DIAGNOSIS — Z79899 Other long term (current) drug therapy: Secondary | ICD-10-CM | POA: Insufficient documentation

## 2017-08-31 DIAGNOSIS — B9681 Helicobacter pylori [H. pylori] as the cause of diseases classified elsewhere: Secondary | ICD-10-CM | POA: Insufficient documentation

## 2017-08-31 DIAGNOSIS — K219 Gastro-esophageal reflux disease without esophagitis: Secondary | ICD-10-CM | POA: Insufficient documentation

## 2017-08-31 DIAGNOSIS — Z7982 Long term (current) use of aspirin: Secondary | ICD-10-CM | POA: Insufficient documentation

## 2017-08-31 NOTE — Progress Notes (Signed)
Patient ID: Darren Burns, male   DOB: May 12, 1975, 42 y.o.   MRN: [id number]   Darren Burns, is a 42 y.o. male  [id number]:[id number]  [id number]:[id number]  DOB - 10-15-1975  Subjective:  Chief Complaint and HPI: Darren Burns is a 42 y.o. male here today for questions about h.pylori infection.  So, he took the treatment incorrectly.  He took the clarithromycin as directed.  He took the amoxicillin at only 500mg  bid and didn't take the omeprazole at all during treatment.  He is still having some stomach burning but it is much improved.    ROS:   Constitutional:  No f/c, No night sweats, No unexplained weight loss. EENT:  No vision changes, No blurry vision, No hearing changes. No mouth, throat, or ear problems.  Respiratory: No cough, No SOB Cardiac: No CP, no palpitations GI:  +abd burning, No N/V/D. GU: No Urinary s/sx Musculoskeletal: No joint pain Neuro: No headache, no dizziness, no motor weakness.  Skin: No rash Endocrine:  No polydipsia. No polyuria.  Psych: Denies SI/HI  No problems updated.  ALLERGIES: Allergies  Allergen Reactions  . Ibuprofen Swelling and Other (See Comments)    intolerance  . Tamiflu [Oseltamivir Phosphate] Nausea And Vomiting    PAST MEDICAL HISTORY: Past Medical History:  Diagnosis Date  . GERD (gastroesophageal reflux disease) 08/17/2016    MEDICATIONS AT HOME: Prior to Admission medications   Medication Sig Start Date End Date Taking? Authorizing Provider  aspirin EC 81 MG tablet Take 1 tablet Monday, Wednesday, and friday 04/14/17  Yes , Dionne Bucy, PA-C  omeprazole (PRILOSEC) 20 MG capsule Take 1 capsule (20 mg total) by mouth 2 (two) times daily before a meal. 08/09/17  Yes ,  M, PA-C  polyethylene glycol powder (GLYCOLAX/MIRALAX) powder Take 17 g by mouth daily. 11/26/16  Yes Charlott Rakes, MD  acetaminophen (TYLENOL) 325 MG tablet Take 2 tablets (650 mg total) by mouth every 6 (six) hours as needed for mild pain  or headache. Patient not taking: Reported on 11/26/2016 11/10/14   Domenic Polite, MD  amoxicillin (AMOXIL) 500 MG capsule Take 2 capsules (1,000 mg total) by mouth 2 (two) times daily. Patient not taking: Reported on 08/31/2017 08/09/17   Argentina Donovan, PA-C     Objective:  EXAM:   Vitals:   08/31/17 1638  BP: 116/74  Pulse: 90  Resp: 18  Temp: 98.9 F (37.2 C)  TempSrc: Oral  SpO2: 97%  Weight: 195 lb (88.5 kg)  Height: 5\' 7"  (1.702 m)    General appearance : A&OX3. NAD. Non-toxic-appearing HEENT: Atraumatic and Normocephalic.  PERRLA. EOM intact.   Neck: supple, no JVD. No cervical lymphadenopathy. No thyromegaly Chest/Lungs:  Breathing-non-labored, Good air entry bilaterally, breath sounds normal without rales, rhonchi, or wheezing  CVS: S1 S2 regular, no murmurs, gallops, rubs  Extremities: Bilateral Lower Ext shows no edema, both legs are warm to touch with = pulse throughout Neurology:  CN II-XII grossly intact, Non focal.   Psych:  TP linear. J/I WNL. Normal speech. Appropriate eye contact and affect.  Skin:  No Rash  Data Review No results found for: HGBA1C   Assessment & Plan   1. H. pylori infection Took meds incorrectly.  Resume omeprazole.  Will wait a few weeks for test of cure.   - H. pylori breath test; Future     Patient have been counseled extensively about nutrition and exercise  Return in about 3 weeks (around 09/21/2017) for make lab appt.  The patient  was given clear instructions to go to ER or return to medical center if symptoms don't improve, worsen or new problems develop. The patient verbalized understanding. The patient was told to call to get lab results if they haven't heard anything in the next week.     Freeman Caldron, PA-C [facility] and [place], [place]   08/31/2017, 4:39 PM

## 2017-08-31 NOTE — Patient Instructions (Signed)
Take omeprazole twice daily for 2 weeks then once daily

## 2017-09-09 ENCOUNTER — Ambulatory Visit: Payer: Self-pay | Admitting: Cardiology

## 2017-09-09 ENCOUNTER — Ambulatory Visit (INDEPENDENT_AMBULATORY_CARE_PROVIDER_SITE_OTHER): Payer: Self-pay | Admitting: Urology

## 2017-09-09 DIAGNOSIS — R3912 Poor urinary stream: Secondary | ICD-10-CM

## 2017-09-09 DIAGNOSIS — N5201 Erectile dysfunction due to arterial insufficiency: Secondary | ICD-10-CM

## 2017-09-09 DIAGNOSIS — N401 Enlarged prostate with lower urinary tract symptoms: Secondary | ICD-10-CM

## 2017-09-09 DIAGNOSIS — N4341 Spermatocele of epididymis, single: Secondary | ICD-10-CM

## 2017-09-20 ENCOUNTER — Other Ambulatory Visit: Payer: Self-pay | Admitting: Urology

## 2017-09-20 DIAGNOSIS — N4341 Spermatocele of epididymis, single: Secondary | ICD-10-CM

## 2017-09-21 ENCOUNTER — Ambulatory Visit: Payer: Self-pay | Attending: Family Medicine

## 2017-09-21 DIAGNOSIS — A048 Other specified bacterial intestinal infections: Secondary | ICD-10-CM | POA: Insufficient documentation

## 2017-09-21 NOTE — Progress Notes (Signed)
Patient here for lab visit  

## 2017-09-23 ENCOUNTER — Telehealth: Payer: Self-pay

## 2017-09-23 LAB — H. PYLORI BREATH TEST: H PYLORI BREATH TEST: NEGATIVE

## 2017-09-23 NOTE — Telephone Encounter (Signed)
CMA spoke to patient to inform on lab results.  Patient understood. Patient verified DOB.  

## 2017-09-23 NOTE — Telephone Encounter (Signed)
-----   Message from Argentina Donovan, [place] sent at 09/23/2017  1:04 PM EDT ----- Please call patient.  He is now negative for the bacteria that causes stomach ulcers!  He should take omeprazole once a day for about 6 more weeks, then he can stop it.  Thanks, Freeman Caldron, PA-C

## 2017-09-29 ENCOUNTER — Ambulatory Visit (HOSPITAL_COMMUNITY)
Admission: RE | Admit: 2017-09-29 | Discharge: 2017-09-29 | Disposition: A | Discharge status: Home / Self Care | Payer: Self-pay | Source: Ambulatory Visit | Attending: Urology | Admitting: Urology

## 2017-09-29 DIAGNOSIS — I861 Scrotal varices: Secondary | ICD-10-CM | POA: Insufficient documentation

## 2017-09-29 DIAGNOSIS — N433 Hydrocele, unspecified: Secondary | ICD-10-CM | POA: Insufficient documentation

## 2017-09-29 DIAGNOSIS — N4341 Spermatocele of epididymis, single: Secondary | ICD-10-CM | POA: Insufficient documentation

## 2017-10-12 ENCOUNTER — Ambulatory Visit: Payer: Self-pay | Attending: Family Medicine

## 2017-10-16 DIAGNOSIS — Z87898 Personal history of other specified conditions: Secondary | ICD-10-CM

## 2017-10-16 HISTORY — DX: Personal history of other specified conditions: Z87.898

## 2017-10-28 ENCOUNTER — Ambulatory Visit (INDEPENDENT_AMBULATORY_CARE_PROVIDER_SITE_OTHER): Payer: No Typology Code available for payment source | Admitting: Urology

## 2017-10-28 DIAGNOSIS — N5201 Erectile dysfunction due to arterial insufficiency: Secondary | ICD-10-CM

## 2017-10-28 DIAGNOSIS — R3912 Poor urinary stream: Secondary | ICD-10-CM

## 2017-10-28 DIAGNOSIS — N401 Enlarged prostate with lower urinary tract symptoms: Secondary | ICD-10-CM

## 2017-10-28 DIAGNOSIS — D2930 Benign neoplasm of unspecified epididymis: Secondary | ICD-10-CM

## 2017-10-31 ENCOUNTER — Ambulatory Visit: Payer: Self-pay | Admitting: Cardiology

## 2017-10-31 ENCOUNTER — Encounter: Payer: Self-pay | Admitting: Cardiology

## 2017-10-31 VITALS — BP 135/91 | HR 68 | Ht 67.0 in | Wt 197.8 lb

## 2017-10-31 DIAGNOSIS — Z7189 Other specified counseling: Secondary | ICD-10-CM

## 2017-10-31 DIAGNOSIS — R079 Chest pain, unspecified: Secondary | ICD-10-CM

## 2017-10-31 NOTE — Patient Instructions (Signed)
Medication Instructions: Your physician recommends that you continue on your current medications as directed.    If you need a refill on your cardiac medications before your next appointment, please call your pharmacy.   Labwork: None  Procedures/Testing: Your physician has requested that you have an exercise tolerance test. For further information please visit [email or web address]. Please also follow instruction sheet, as given. [place]   Follow-Up: Your physician wants you to follow-up as needed with Dr. Harrell Gave. You can call our office at [phone] to schedule an appointment.   Special Instructions:    Thank you for choosing [facility] at [facility]!!

## 2017-10-31 NOTE — Progress Notes (Signed)
Cardiology Office Note:    Date:  10/31/2017   ID:  Darren Burns, DOB Mar 17, 1975, MRN [id number]  PCP:  Charlott Rakes, MD  Cardiologist:  Buford Dresser, MD PhD  Referring MD: Argentina Donovan, PA-C   CC: Chest pain  History of Present Illness:    Darren Burns is a 42 y.o. male with a hx of GERD with H. Pylori infection who is seen as a new consult at the request of Mathis Dad for the evaluation and management of chest pain.  Per notes 08/04/17, he had two episodes of chest pain in early June that lasted about 1 hour. Occurred at the end of the day. Associated with feeling weak, short of breath. Today he endorses that he has had no more of those types of pain since starting H pylori treatment.   Has a stressful job, sometimes only gets 2-3 hours of sleep per night. 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 When he wakes up suddenly, feels a slight pressure in his chest that goes away instantly after that. No nausea, vomiting, SOB, diaphoresis, dizziness with that. Had had intermittent sweating, but seems to be related to heat/weather.   He would like to be more active and lose weight, but he is worried about his heart.  No palpitations, PND, orthopnea, LE edema, syncope.  Social history: quit smoking around 2005. No alcohol. No illicits. Lives with wife and 4 sons (has 62 total, oldest lives away). Tries to eat well, but does eat some fast food/pizza. Family history: no heart issues, other medical issues like cancer.  Past Medical History:  Diagnosis Date  . GERD (gastroesophageal reflux disease) 08/17/2016    History reviewed. No pertinent surgical history. Has pending testicular surgery. For fluid accumulation.  Current Medications: Current Outpatient Medications on File Prior to Visit  Medication Sig  . omeprazole (PRILOSEC) 20 MG capsule Take 1 capsule (20 mg total) by mouth 2 (two)  times daily before a meal.  . polyethylene glycol powder (GLYCOLAX/MIRALAX) powder Take 17 g by mouth daily.   No current facility-administered medications on file prior to visit.      Allergies:   Ibuprofen and Tamiflu [oseltamivir phosphate]   Social History   Socioeconomic History  . Marital status: Married    Spouse name: Not on file  . Number of children: Not on file  . Years of education: Not on file  . Highest education level: Not on file  Occupational History  . Not on file  Social Needs  . Financial resource strain: Not on file  . Food insecurity:    Worry: Not on file    Inability: Not on file  . Transportation needs:    Medical: Not on file    Non-medical: Not on file  Tobacco Use  . Smoking status: Former Smoker    Last attempt to quit: 2005    Years since quitting: 14.7  . Smokeless tobacco: Never Used  Substance and Sexual Activity  . Alcohol use: No  . Drug use: No  . Sexual activity: Yes  Lifestyle  . Physical activity:    Days per week: Not on file    Minutes per session: Not on file  . Stress: Not on file  Relationships  . Social connections:    Talks on phone: Not on file    Gets together: Not on file    Attends religious service: Not on file    Active member of club  or organization: Not on file    Attends meetings of clubs or organizations: Not on file    Relationship status: Not on file  Other Topics Concern  . Not on file  Social History Narrative  . Not on file     Family History: The patient's family history is notable for no heart issues, other medical issues like cancer.  ROS:   Please see the history of present illness.  Additional pertinent ROS: Review of Systems  Constitutional: Negative for chills, fever and weight loss.  HENT: Positive for tinnitus. Negative for ear pain and hearing loss.   Eyes: Negative for blurred vision and pain.  Respiratory: Positive for cough. Negative for shortness of breath and wheezing.     Cardiovascular: Positive for chest pain. Negative for palpitations, orthopnea, claudication, leg swelling and PND.  Gastrointestinal: Negative for abdominal pain, blood in stool and melena.  Genitourinary: Negative for dysuria and hematuria.  Musculoskeletal: Negative for falls and myalgias.  Skin: Negative for rash.  Neurological: Negative for dizziness, sensory change, focal weakness and loss of consciousness.  Endo/Heme/Allergies: Does not bruise/bleed easily.     EKGs/Labs/Other Studies Reviewed:    The following studies were reviewed today: Prior notes and imaging  EKG:  EKG is ordered today.  The ekg ordered today demonstrates normal sinus rhythm  Recent Labs: 08/04/2017: ALT 33; BUN 10; Creatinine, Ser 0.93; Potassium 4.2; Sodium 142; TSH 1.850  Recent Lipid Panel    Component Value Date/Time   CHOL 166 11/30/2016 0857   TRIG 99 11/30/2016 0857   HDL 37 (L) 11/30/2016 0857   CHOLHDL 4.5 11/30/2016 0857   CHOLHDL 4.4 12/22/2015 0905   VLDL 28 12/22/2015 0905   LDLCALC 109 (H) 11/30/2016 0857    Physical Exam:    VS:  BP (!) 135/91 (BP Location: Right Arm)   Pulse 68   Ht 5\' 7"  (1.702 m)   Wt 197 lb 12.8 oz (89.7 kg)   BMI 30.98 kg/m     Wt Readings from Last 3 Encounters:  10/31/17 197 lb 12.8 oz (89.7 kg)  08/31/17 195 lb (88.5 kg)  08/04/17 201 lb (91.2 kg)     GEN: Well nourished, well developed in no acute distress HEENT: Normal NECK: No JVD; No carotid bruits LYMPHATICS: No lymphadenopathy CARDIAC: regular rhythm, normal S1 and S2, no murmurs, rubs, gallops. Radial and DP pulses 2+ bilaterally. RESPIRATORY:  Clear to auscultation without rales, wheezing or rhonchi  ABDOMEN: Soft, non-tender, non-distended MUSCULOSKELETAL:  No edema; No deformity  SKIN: Warm and dry NEUROLOGIC:  Alert and oriented x 3 PSYCHIATRIC:  Normal affect   ASSESSMENT:    1. Chest pain, unspecified type   2. Counseling on health promotion and disease prevention    PLAN:     1. Chest pain: -atypical in nature, but very worrisome to patient. He has borderline blood pressure today and has a lot of stress in his life. Baseline normal ECG, no diabetes, so appropriate for exercise treadmill test. Not on beta blockers. -discussed risk of false positive with testing, he understands there may be a need for additional evaluation based on results of testing.  2. Primary prevention: The 10-year ASCVD risk score Mikey Bussing DC Jr., et al., 2013) is: 2.1%   Values used to calculate the score:     Age: 63 years     Sex: Male     Is Non-Hispanic African American: No     Diabetic: No     Tobacco smoker: No  Systolic Blood Pressure: 092 mmHg     Is BP treated: No     HDL Cholesterol: 37 mg/dL     Total Cholesterol: 166 mg/dL  Discussed diet, exercise. Stress test will help [place] confirm that he is safe to exercise to high intensity  Plan for follow up: depending on results of stress test. Likely PRN.  Medication Adjustments/Labs and Tests Ordered: Current medicines are reviewed at length with the patient today.  Concerns regarding medicines are outlined above.  Orders Placed This Encounter  Procedures  . Exercise Tolerance Test  . EKG 12-Lead   No orders of the defined types were placed in this encounter.   Patient Instructions  Medication Instructions: Your physician recommends that you continue on your current medications as directed.    If you need a refill on your cardiac medications before your next appointment, please call your pharmacy.   Labwork: None  Procedures/Testing: Your physician has requested that you have an exercise tolerance test. For further information please visit [email or web address]. Please also follow instruction sheet, as given. [place]   Follow-Up: Your physician wants you to follow-up as needed with Dr. Harrell Gave. You can call our office at [phone] to schedule an appointment.   Special  Instructions:    Thank you for choosing [facility] at [facility]!!       Signed, Buford Dresser, MD PhD 10/31/2017 9:58 AM    [place]

## 2017-11-03 ENCOUNTER — Telehealth (HOSPITAL_COMMUNITY): Payer: Self-pay

## 2017-11-03 ENCOUNTER — Other Ambulatory Visit: Payer: Self-pay | Admitting: Urology

## 2017-11-03 NOTE — Telephone Encounter (Signed)
Encounter complete. 

## 2017-11-04 ENCOUNTER — Ambulatory Visit (HOSPITAL_COMMUNITY)
Admission: RE | Admit: 2017-11-04 | Discharge: 2017-11-04 | Disposition: A | Discharge status: Home / Self Care | Payer: Self-pay | Source: Ambulatory Visit | Attending: Internal Medicine | Admitting: Internal Medicine

## 2017-11-04 DIAGNOSIS — R079 Chest pain, unspecified: Secondary | ICD-10-CM | POA: Insufficient documentation

## 2017-11-04 LAB — EXERCISE TOLERANCE TEST
CHL CUP MPHR: 176 {beats}/min
CHL CUP RESTING HR STRESS: 74 {beats}/min
CHL RATE OF PERCEIVED EXERTION: 19
CSEPEDS: 0 s
Estimated workload: 13.4 METS
Exercise duration (min): 11 min
Peak HR: 162 {beats}/min
Percent HR: 92 %

## 2017-11-17 ENCOUNTER — Inpatient Hospital Stay (HOSPITAL_COMMUNITY): Admission: RE | Admit: 2017-11-17 | Payer: Self-pay | Source: Ambulatory Visit

## 2017-11-22 ENCOUNTER — Other Ambulatory Visit: Payer: Self-pay

## 2017-11-22 ENCOUNTER — Encounter (HOSPITAL_COMMUNITY): Payer: Self-pay

## 2017-11-22 ENCOUNTER — Encounter (HOSPITAL_COMMUNITY)
Admission: RE | Admit: 2017-11-22 | Discharge: 2017-11-22 | Disposition: A | Discharge status: Home / Self Care | Payer: No Typology Code available for payment source | Source: Ambulatory Visit | Attending: Urology | Admitting: Urology

## 2017-11-22 DIAGNOSIS — Z01812 Encounter for preprocedural laboratory examination: Secondary | ICD-10-CM | POA: Insufficient documentation

## 2017-11-22 DIAGNOSIS — N509 Disorder of male genital organs, unspecified: Secondary | ICD-10-CM | POA: Insufficient documentation

## 2017-11-22 DIAGNOSIS — N400 Enlarged prostate without lower urinary tract symptoms: Secondary | ICD-10-CM | POA: Insufficient documentation

## 2017-11-22 HISTORY — DX: Personal history of other specified conditions: Z87.898

## 2017-11-22 HISTORY — DX: Other specified disorders of the male genital organs: N50.89

## 2017-11-22 HISTORY — DX: Personal history of other infectious and parasitic diseases: Z86.19

## 2017-11-22 HISTORY — DX: Frequency of micturition: R35.0

## 2017-11-22 LAB — CBC
HCT: 45.3 % (ref 39.0–52.0)
Hemoglobin: 15.6 g/dL (ref 13.0–17.0)
MCH: 29.5 pg (ref 26.0–34.0)
MCHC: 34.4 g/dL (ref 30.0–36.0)
MCV: 85.8 fL (ref 80.0–100.0)
PLATELETS: 284 10*3/uL (ref 150–400)
RBC: 5.28 MIL/uL (ref 4.22–5.81)
RDW: 13.7 % (ref 11.5–15.5)
WBC: 6.2 10*3/uL (ref 4.0–10.5)
nRBC: 0 % (ref 0.0–0.2)

## 2017-11-22 NOTE — Progress Notes (Signed)
EKG dated 10-31-2017 in epic.

## 2017-11-22 NOTE — Patient Instructions (Addendum)
Darren Burns  1975-08-28    Your procedure is scheduled on:  11-24-2017    Report to [facility] Main  Entrance,  Report to admitting at  8:45 AM    Call this number if you have problems the morning of surgery [phone]     Remember: Do not eat food or drink liquids :After Midnight.                     BRUSH YOUR TEETH MORNING OF SURGERY AND RINSE YOUR MOUTH OUT, NO CHEWING GUM CANDY OR MINTS.     Takes these medication morning for surgery with sips of water:  Omeprazole             You may not have any metal on your body including hair pins and              piercings            Do not wear jewelry, lotions, powders or perfumes, deodorant                         Men may shave face and neck.   Do not bring valuables to the hospital. [place].  Contacts, dentures or bridgework may not be worn into surgery.  Leave suitcase in the car. After surgery it may be brought to your room.     Patients discharged the day of surgery will not be allowed to drive home.  Name and phone number of your driver:  Wife-- Theodosia Blender  [phone]  Special Instructions: N/A              Please read over the following fact sheets you were given: _____________________________________________________________________             [facility] - Preparing for Surgery Before surgery, you can play an important role.  Because skin is not sterile, your skin needs to be as free of germs as possible.  You can reduce the number of germs on your skin by washing with CHG (chlorahexidine gluconate) soap before surgery.  CHG is an antiseptic cleaner which kills germs and bonds with the skin to continue killing germs even after washing. Please DO NOT use if you have an allergy to CHG or antibacterial soaps.  If your skin becomes reddened/irritated stop using the CHG and inform your nurse when you arrive at Short Stay. Do not  shave (including legs and underarms) for at least 48 hours prior to the first CHG shower.  You may shave your face/neck. Please follow these instructions carefully:  1.  Shower with CHG Soap the night before surgery and the  morning of Surgery.  2.  If you choose to wash your hair, wash your hair first as usual with your  normal  shampoo.  3.  After you shampoo, rinse your hair and body thoroughly to remove the  shampoo.                           4.  Use CHG as you would any other liquid soap.  You can apply chg directly  to the skin and wash  Gently with a scrungie or clean washcloth.  5.  Apply the CHG Soap to your body ONLY FROM THE NECK DOWN.   Do not use on face/ open                           Wound or open sores. Avoid contact with eyes, ears mouth and genitals (private parts).                       Wash face,  Genitals (private parts) with your normal soap.             6.  Wash thoroughly, paying special attention to the area where your surgery  will be performed.  7.  Thoroughly rinse your body with warm water from the neck down.  8.  DO NOT shower/wash with your normal soap after using and rinsing off  the CHG Soap.             9.  Pat yourself dry with a clean towel.            10.  Wear clean pajamas.            11.  Place clean sheets on your bed the night of your first shower and do not  sleep with pets. Day of Surgery : Do not apply any lotions/deodorants the morning of surgery.  Please wear clean clothes to the hospital/surgery center.  FAILURE TO FOLLOW THESE INSTRUCTIONS MAY RESULT IN THE CANCELLATION OF YOUR SURGERY PATIENT SIGNATURE_________________________________  NURSE SIGNATURE__________________________________  ________________________________________________________________________

## 2017-11-23 NOTE — H&P (Signed)
CC: AUA Questions Scoring.  HPI: Darren Burns is a 42 year-old male established patient who is here for evaluation.      CC/HPI: CC: Scrotal Mass, ED and Post void dribbling   Darren Burns returns today follow a scrotal US for an enlarging paratesticular mass. He first notice the lesion on the left 2 years ago and it has gotten larger. He doesn't have pain with in unless he squeezes it. He had a hyperechoic 28mm mass in the left epididymis which is concerning for neoplasm or lipoma.   He also has some issues with post void dribbling that has been progressive over the last 3 years. He has quit the coffee which has helped. He has a reduced stream, he has to strain to void. He doesn't always feel he empties well. He has some frequency and urgency as well as nocturia x 1. The irritative symptoms have improved with reduced coffee intake as well. His IPSS is 20. His PVR is low. His stream is reduced but he had a small volume void. He has had no UTI's, hematuria, stone or GU surgery.   He has some difficulty with the erections for the last 2-3 years. He can have sex but the erection isn't is strong. He is no longer able to perform more than one time in a session. He has no pain or curvature. He still has a good libido.     AUA Symptom Score: 50% of the time he has the sensation of not emptying his bladder completely when finished urinating. 50% of the time he has to urinate again fewer than two hours after he has finished urinating. Less than 50% of the time he has to start and stop again several times when he urinates. More than 50% of the time he finds it difficult to postpone urination. More than 50% of the time he has a weak urinary stream. 50% of the time he has to push or strain to begin urination. He has to get up to urinate 1 time from the time he goes to bed until the time he gets up in the morning.   Calculated AUA Symptom Score: 20    ALLERGIES: Ibuprofen    MEDICATIONS:  Omeprazole 1 PO Daily     GU PSH: None   NON-GU PSH: None   GU PMH: BPH w/LUTS, I will get a PSA and have him return for a flowrate and PVR. - 09/09/2017 ED due to arterial insufficiency, We discussed medical therapy vs diet and exercise and he is going to see if he can get some weight off. - 09/09/2017 Spermatocele (single), He has a small but enlarging spermatocele on the left. I will get an [place] to confirm and discuss management at f/u. He did have some questions about a vasectomy and we will discuss that further at f/u. - 09/09/2017 Weak Urinary Stream - 09/09/2017    NON-GU PMH: Heartburn    FAMILY HISTORY: None   SOCIAL HISTORY: Marital Status: Married Ethnicity: ; Race: Other Race Current Smoking Status: Patient has never smoked.   Tobacco Use Assessment Completed: Used Tobacco in last 30 days? Does not drink anymore.  Drinks 1 caffeinated drink per day.    REVIEW OF SYSTEMS:    GU Review Male:   Patient reports frequent urination, hard to postpone urination, leakage of urine, stream starts and stops, trouble starting your stream, and erection problems. Patient denies burning/ pain with urination, get up at night to urinate, have to strain to urinate ,  and penile pain.  Gastrointestinal (Upper):   Patient denies nausea, vomiting, and indigestion/ heartburn.  Gastrointestinal (Lower):   Patient denies diarrhea and constipation.  Constitutional:   Patient denies fever, night sweats, weight loss, and fatigue.  Skin:   Patient denies itching and skin rash/ lesion.  Eyes:   Patient denies blurred vision and double vision.  Ears/ Nose/ Throat:   Patient denies sore throat and sinus problems.  Hematologic/Lymphatic:   Patient denies swollen glands and easy bruising.  Cardiovascular:   Patient denies leg swelling and chest pains.  Respiratory:   Patient denies cough and shortness of breath.  Endocrine:   Patient denies excessive thirst.  Musculoskeletal:   Patient denies back pain and  joint pain.  Neurological:   Patient denies headaches and dizziness.  Psychologic:   Patient denies depression and anxiety.   VITAL SIGNS:      10/28/2017 03:50 PM  Weight 199 lb / 90.26 kg  Height 66 in / 167.64 cm  BP 133/93 mmHg  Pulse 69 /min  Temperature 98.2 F / 36.7 C  BMI 32.1 kg/m   MULTI-SYSTEM PHYSICAL EXAMINATION:    Constitutional: Well-nourished. No physical deformities. Normally developed. Good grooming.  Respiratory: No labored breathing, no use of accessory muscles. CTA  Cardiovascular: Normal temperature, RRR without murmur     PAST DATA REVIEWED:  Source Of History:  Patient  Records Review:   AUA Symptom Score  Urine Test Review:   Urinalysis  Urodynamics Review:   Review Bladder Scan, Review Flow Rate  X-Ray Review: Scrotal Ultrasound: Reviewed Films. Reviewed Report. Discussed With Patient.     PROCEDURES:         Flow Rate - 51741  Peak Flow Rate: 5 cc/sec  Voided Volume: 43 cc  Average Flow Rate: 3 cc/sec  Total Void Time: 0:13 min:sec  Time of Peak Flow: 0:04 min:sec  Flow Time: 0:11 min:sec           PVR Ultrasound - 17616  Scanned Volume: 40.3 cc         Urinalysis - 81003 Dipstick Dipstick Cont'd  Specimen: Voided Bilirubin: 1+  Color: Yellow Ketones: Neg  Appearance: Clear Blood: Neg  Specific Gravity: 1.015 Protein: Neg  pH: 6.0 Urobilinogen: 0.2  Glucose: Neg Nitrites: Neg    Leukocyte Esterase: Trace    ASSESSMENT:      ICD-10 Details  1 GU:   Benign epididymal mass - D29.30 He has a solid 59mm left epididymal mass. I am going to get him set up for surgical excision which will probably be transcrotal but I will review the literature and make sure he doesn't need an inguinal approach. I have reviewed the risks of bleeding, infection, loss of the testicle with testosterone decline and fertility issues, thrombotic events and anesthetic complications. He is interested in a vasectomy which could be accomplished simultaneously and I  reviewed the risks of that procedure as well. He is a good candidate for a vasectomy. I reviewed the risks of bleeding, infection, testicular atrophy, chronic pain and failure of the procedure. I also reviewed the need for a semen specimen in 3 months to confirm the outcome. he was given our pamphlet to review and the postoperative instructions and expectations were reviewed.   2   BPH w/LUTS - N40.1 He has persistent LUTS but is emptying well. The stream was reduced on a small void. I am going to try him on tamsulosin and reviewed the side effects and instructions. I will do  cystoscopy at the time of his epididymal mass excision.   3   Weak Urinary Stream - R39.12   4   ED due to arterial insufficiency - N52.01 He would like to try oral meds so I gave him a script for sildenafil with instructions and a review of the side effects.    PLAN:            Medications New Meds: Tamsulosin Hcl 0.4 mg capsule 1 capsule PO Daily   #30  11 Refill(s)

## 2017-11-24 ENCOUNTER — Encounter (HOSPITAL_COMMUNITY): Payer: Self-pay | Admitting: Emergency Medicine

## 2017-11-24 ENCOUNTER — Ambulatory Visit (HOSPITAL_COMMUNITY): Payer: Self-pay | Admitting: Anesthesiology

## 2017-11-24 ENCOUNTER — Ambulatory Visit (HOSPITAL_COMMUNITY)
Admission: RE | Admit: 2017-11-24 | Discharge: 2017-11-24 | Disposition: A | Discharge status: Home / Self Care | Payer: Self-pay | Source: Ambulatory Visit | Attending: Urology | Admitting: Urology

## 2017-11-24 ENCOUNTER — Encounter (HOSPITAL_COMMUNITY)
Admission: RE | Disposition: A | Discharge status: Home / Self Care | Payer: Self-pay | Source: Ambulatory Visit | Attending: Urology

## 2017-11-24 DIAGNOSIS — N401 Enlarged prostate with lower urinary tract symptoms: Secondary | ICD-10-CM | POA: Insufficient documentation

## 2017-11-24 DIAGNOSIS — R3912 Poor urinary stream: Secondary | ICD-10-CM | POA: Insufficient documentation

## 2017-11-24 DIAGNOSIS — N5201 Erectile dysfunction due to arterial insufficiency: Secondary | ICD-10-CM | POA: Insufficient documentation

## 2017-11-24 DIAGNOSIS — Z87891 Personal history of nicotine dependence: Secondary | ICD-10-CM | POA: Insufficient documentation

## 2017-11-24 DIAGNOSIS — D2932 Benign neoplasm of left epididymis: Secondary | ICD-10-CM | POA: Insufficient documentation

## 2017-11-24 DIAGNOSIS — N138 Other obstructive and reflux uropathy: Secondary | ICD-10-CM | POA: Insufficient documentation

## 2017-11-24 DIAGNOSIS — R350 Frequency of micturition: Secondary | ICD-10-CM | POA: Insufficient documentation

## 2017-11-24 DIAGNOSIS — K219 Gastro-esophageal reflux disease without esophagitis: Secondary | ICD-10-CM | POA: Insufficient documentation

## 2017-11-24 HISTORY — PX: CYSTOSCOPY: SHX5120

## 2017-11-24 HISTORY — PX: SCROTAL EXPLORATION: SHX2386

## 2017-11-24 SURGERY — CYSTOSCOPY
Anesthesia: General | Laterality: Right

## 2017-11-24 MED ORDER — ONDANSETRON HCL 4 MG/2ML IJ SOLN
INTRAMUSCULAR | Status: AC
  Filled 2017-11-24: qty 2

## 2017-11-24 MED ORDER — KETOROLAC TROMETHAMINE 30 MG/ML IJ SOLN
INTRAMUSCULAR | Status: DC | PRN
  Administered 2017-11-24: 30 mg via INTRAVENOUS

## 2017-11-24 MED ORDER — LABETALOL HCL 5 MG/ML IV SOLN
INTRAVENOUS | Status: DC | PRN
  Administered 2017-11-24: 5 mg via INTRAVENOUS

## 2017-11-24 MED ORDER — KETOROLAC TROMETHAMINE 30 MG/ML IJ SOLN
INTRAMUSCULAR | Status: AC
  Filled 2017-11-24: qty 1

## 2017-11-24 MED ORDER — SUGAMMADEX SODIUM 500 MG/5ML IV SOLN
INTRAVENOUS | Status: AC
  Filled 2017-11-24: qty 5

## 2017-11-24 MED ORDER — HYDROMORPHONE HCL 1 MG/ML IJ SOLN
INTRAMUSCULAR | Status: AC
  Administered 2017-11-24: 0.5 mg via INTRAVENOUS
  Filled 2017-11-24: qty 1

## 2017-11-24 MED ORDER — PROMETHAZINE HCL 25 MG/ML IJ SOLN: 6.2500 mg | INTRAMUSCULAR | Status: DC | PRN

## 2017-11-24 MED ORDER — MIDAZOLAM HCL 5 MG/5ML IJ SOLN
INTRAMUSCULAR | Status: DC | PRN
  Administered 2017-11-24: 2 mg via INTRAVENOUS

## 2017-11-24 MED ORDER — DEXAMETHASONE SODIUM PHOSPHATE 10 MG/ML IJ SOLN
INTRAMUSCULAR | Status: AC
  Filled 2017-11-24: qty 1

## 2017-11-24 MED ORDER — HYDROCODONE-ACETAMINOPHEN 5-325 MG PO TABS: 1.0000 | ORAL_TABLET | Freq: Four times a day (QID) | ORAL | 0 refills | Status: AC | PRN

## 2017-11-24 MED ORDER — PROPOFOL 10 MG/ML IV BOLUS
INTRAVENOUS | Status: DC | PRN
  Administered 2017-11-24: 200 mg via INTRAVENOUS

## 2017-11-24 MED ORDER — ROCURONIUM BROMIDE 100 MG/10ML IV SOLN
INTRAVENOUS | Status: DC | PRN
  Administered 2017-11-24: 50 mg via INTRAVENOUS

## 2017-11-24 MED ORDER — LIDOCAINE HCL (PF) 1 % IJ SOLN
INTRAMUSCULAR | Status: DC | PRN
  Administered 2017-11-24: 5 mL

## 2017-11-24 MED ORDER — FENTANYL CITRATE (PF) 250 MCG/5ML IJ SOLN
INTRAMUSCULAR | Status: AC
  Filled 2017-11-24: qty 5

## 2017-11-24 MED ORDER — LIDOCAINE HCL URETHRAL/MUCOSAL 2 % EX GEL
CUTANEOUS | Status: AC
  Filled 2017-11-24: qty 5

## 2017-11-24 MED ORDER — LIDOCAINE HCL (CARDIAC) PF 50 MG/5ML IV SOSY
PREFILLED_SYRINGE | INTRAVENOUS | Status: DC | PRN
  Administered 2017-11-24: 100 mg via INTRAVENOUS
  Administered 2017-11-24: 25 mg via INTRAVENOUS

## 2017-11-24 MED ORDER — STERILE WATER FOR IRRIGATION IR SOLN
Status: DC | PRN
  Administered 2017-11-24: 3000 mL

## 2017-11-24 MED ORDER — LIDOCAINE HCL (CARDIAC) PF 100 MG/5ML IV SOSY
PREFILLED_SYRINGE | INTRAVENOUS | Status: AC
  Filled 2017-11-24: qty 5

## 2017-11-24 MED ORDER — MIDAZOLAM HCL 2 MG/2ML IJ SOLN: 0.5000 mg | Freq: Once | INTRAMUSCULAR | Status: DC | PRN

## 2017-11-24 MED ORDER — LIDOCAINE HCL (PF) 1 % IJ SOLN
INTRAMUSCULAR | Status: AC
  Filled 2017-11-24: qty 30

## 2017-11-24 MED ORDER — CEFAZOLIN SODIUM-DEXTROSE 2-4 GM/100ML-% IV SOLN
2.0000 g | INTRAVENOUS | Status: AC
  Administered 2017-11-24: 2 g via INTRAVENOUS
  Filled 2017-11-24: qty 100

## 2017-11-24 MED ORDER — DEXAMETHASONE SODIUM PHOSPHATE 10 MG/ML IJ SOLN
INTRAMUSCULAR | Status: DC | PRN
  Administered 2017-11-24: 10 mg via INTRAVENOUS

## 2017-11-24 MED ORDER — HYDROMORPHONE HCL 1 MG/ML IJ SOLN
0.2500 mg | INTRAMUSCULAR | Status: DC | PRN
  Administered 2017-11-24 (×2): 0.5 mg via INTRAVENOUS

## 2017-11-24 MED ORDER — LACTATED RINGERS IV SOLN
INTRAVENOUS | Status: DC
  Administered 2017-11-24 (×2): via INTRAVENOUS

## 2017-11-24 MED ORDER — SUGAMMADEX SODIUM 200 MG/2ML IV SOLN
INTRAVENOUS | Status: DC | PRN
  Administered 2017-11-24: 200 mg via INTRAVENOUS

## 2017-11-24 MED ORDER — DIPHENHYDRAMINE HCL 50 MG/ML IJ SOLN
INTRAMUSCULAR | Status: DC | PRN
  Administered 2017-11-24: 12.5 mg via INTRAVENOUS

## 2017-11-24 MED ORDER — ONDANSETRON HCL 4 MG/2ML IJ SOLN
INTRAMUSCULAR | Status: DC | PRN
  Administered 2017-11-24: 4 mg via INTRAVENOUS

## 2017-11-24 MED ORDER — MIDAZOLAM HCL 2 MG/2ML IJ SOLN
INTRAMUSCULAR | Status: AC
  Filled 2017-11-24: qty 2

## 2017-11-24 MED ORDER — PROPOFOL 10 MG/ML IV BOLUS
INTRAVENOUS | Status: AC
  Filled 2017-11-24: qty 20

## 2017-11-24 MED ORDER — MEPERIDINE HCL 50 MG/ML IJ SOLN: 6.2500 mg | INTRAMUSCULAR | Status: DC | PRN

## 2017-11-24 MED ORDER — FENTANYL CITRATE (PF) 100 MCG/2ML IJ SOLN
INTRAMUSCULAR | Status: DC | PRN
  Administered 2017-11-24: 100 ug via INTRAVENOUS
  Administered 2017-11-24: 50 ug via INTRAVENOUS
  Administered 2017-11-24: 100 ug via INTRAVENOUS
  Administered 2017-11-24: 50 ug via INTRAVENOUS

## 2017-11-24 MED ORDER — BUPIVACAINE HCL (PF) 0.25 % IJ SOLN
INTRAMUSCULAR | Status: AC
  Filled 2017-11-24: qty 30

## 2017-11-24 MED ORDER — BUPIVACAINE HCL 0.25 % IJ SOLN
INTRAMUSCULAR | Status: DC | PRN
  Administered 2017-11-24: 5 mL

## 2017-11-24 SURGICAL SUPPLY — 42 items
BAG URO CATCHER STRL LF (MISCELLANEOUS) ×4 IMPLANT
BENZOIN TINCTURE PRP APPL 2/3 (GAUZE/BANDAGES/DRESSINGS) IMPLANT
BLADE HEX COATED 2.75 (ELECTRODE) ×4 IMPLANT
BLADE SURG 15 STRL LF DISP TIS (BLADE) ×3 IMPLANT
BLADE SURG 15 STRL SS (BLADE) ×1
BNDG GAUZE ELAST 4 BULKY (GAUZE/BANDAGES/DRESSINGS) ×4 IMPLANT
CATH URET 5FR 28IN OPEN ENDED (CATHETERS) IMPLANT
CLOTH BEACON ORANGE TIMEOUT ST (SAFETY) ×4 IMPLANT
COVER SURGICAL LIGHT HANDLE (MISCELLANEOUS) ×4 IMPLANT
COVER WAND RF STERILE (DRAPES) ×4 IMPLANT
DECANTER SPIKE VIAL GLASS SM (MISCELLANEOUS) ×8 IMPLANT
DERMABOND ADVANCED (GAUZE/BANDAGES/DRESSINGS) ×1
DERMABOND ADVANCED .7 DNX12 (GAUZE/BANDAGES/DRESSINGS) ×3 IMPLANT
DRAIN PENROSE 18X1/2 LTX STRL (DRAIN) IMPLANT
DRAIN PENROSE 18X1/4 LTX STRL (WOUND CARE) ×4 IMPLANT
DRAPE LAPAROTOMY T 102X78X121 (DRAPES) ×4 IMPLANT
ELECT NEEDLE TIP 2.8 STRL (NEEDLE) ×4 IMPLANT
ELECT PENCIL ROCKER SW 15FT (MISCELLANEOUS) ×4 IMPLANT
ELECT REM PT RETURN 15FT ADLT (MISCELLANEOUS) ×4 IMPLANT
GAUZE SPONGE 4X4 12PLY STRL (GAUZE/BANDAGES/DRESSINGS) IMPLANT
GLOVE SURG SS PI 8.0 STRL IVOR (GLOVE) ×4 IMPLANT
GOWN STRL REUS W/TWL XL LVL3 (GOWN DISPOSABLE) ×4 IMPLANT
GUIDEWIRE STR DUAL SENSOR (WIRE) IMPLANT
KIT BASIN OR (CUSTOM PROCEDURE TRAY) ×4 IMPLANT
MANIFOLD NEPTUNE II (INSTRUMENTS) ×4 IMPLANT
NS IRRIG 1000ML POUR BTL (IV SOLUTION) ×4 IMPLANT
PACK BASIC VI WITH GOWN DISP (CUSTOM PROCEDURE TRAY) ×4 IMPLANT
PACK CYSTO (CUSTOM PROCEDURE TRAY) IMPLANT
PACK GENERAL/GYN (CUSTOM PROCEDURE TRAY) ×4 IMPLANT
SPONGE LAP 4X18 RFD (DISPOSABLE) ×8 IMPLANT
SUPPORT SCROTAL LG STRP (MISCELLANEOUS) ×4 IMPLANT
SUT CHROMIC 3 0 SH 27 (SUTURE) ×4 IMPLANT
SUT CHROMIC 4 0 SH 27 (SUTURE) IMPLANT
SUT MNCRL AB 4-0 PS2 18 (SUTURE) ×4 IMPLANT
SUT VIC AB 2-0 SH 27 (SUTURE)
SUT VIC AB 2-0 SH 27X BRD (SUTURE) IMPLANT
SUT VIC AB 3-0 SH 27 (SUTURE) ×1
SUT VIC AB 3-0 SH 27XBRD (SUTURE) ×3 IMPLANT
SUT VICRYL 0 TIES 12 18 (SUTURE) IMPLANT
SYR CONTROL 10ML LL (SYRINGE) IMPLANT
TUBING CONNECTING 10 (TUBING) ×4 IMPLANT
WATER STERILE IRR 1000ML POUR (IV SOLUTION) IMPLANT

## 2017-11-24 NOTE — Transfer of Care (Signed)
Immediate Anesthesia Transfer of Care Note  Patient: Darren Burns  Procedure(s) Performed: FLEX CYSTOSCOPY (N/A ) LEFT INGUINAL EXPLORATION WITH EXCISION OF EPIDYIMAL MASS (Left )  Patient Location: PACU  Anesthesia Type:General  Level of Consciousness: awake, alert , oriented and patient cooperative  Airway & Oxygen Therapy: Patient Spontanous Breathing and Patient connected to face mask oxygen  Post-op Assessment: Report given to RN, Post -op Vital signs reviewed and stable and Patient moving all extremities X 4  Post vital signs: stable  Last Vitals:  Vitals Value Taken Time  BP 120/83 11/24/2017 11:49 AM  Temp    Pulse 72 11/24/2017 11:54 AM  Resp 12 11/24/2017 11:54 AM  SpO2 100 % 11/24/2017 11:54 AM  Vitals shown include unvalidated device data.  Last Pain:  Vitals:   11/24/17 0951  TempSrc:   PainSc: 2       Patients Stated Pain Goal: 4 (43/60/67 7034)  Complications: No apparent anesthesia complications

## 2017-11-24 NOTE — Anesthesia Preprocedure Evaluation (Addendum)
Anesthesia Evaluation  Patient identified by MRN, date of birth, ID band Patient awake    Reviewed: Allergy & Precautions, NPO status , Patient's Chart, lab work & pertinent test results  History of Anesthesia Complications Negative for: history of anesthetic complications  Airway Mallampati: II  TM Distance: >3 FB Neck ROM: Full    Dental  (+) Dental Advisory Given, Teeth Intact   Pulmonary former smoker (quit 2000),    breath sounds clear to auscultation       Cardiovascular negative cardio ROS   Rhythm:Regular Rate:Normal  11/04/17 Excellent exercise tolerance on the treadmill. No concerning findings at all. No evidence of decreased blood flow to the heart   Neuro/Psych negative neurological ROS     GI/Hepatic Neg liver ROS, GERD  Medicated and Controlled,  Endo/Other  negative endocrine ROS  Renal/GU negative Renal ROS   Epididymal mass    Musculoskeletal negative musculoskeletal ROS (+)   Abdominal (+) + obese,   Peds  Hematology negative hematology ROS (+)   Anesthesia Other Findings   Reproductive/Obstetrics                            Anesthesia Physical Anesthesia Plan  ASA: II  Anesthesia Plan: General   Post-op Pain Management:    Induction: Intravenous  PONV Risk Score and Plan: 3 and Ondansetron, Dexamethasone and Scopolamine patch - Pre-op  Airway Management Planned: Oral ETT  Additional Equipment:   Intra-op Plan:   Post-operative Plan: Extubation in OR  Informed Consent: I have reviewed the patients History and Physical, chart, labs and discussed the procedure including the risks, benefits and alternatives for the proposed anesthesia with the patient or authorized representative who has indicated his/her understanding and acceptance.   Dental advisory given  Plan Discussed with: CRNA and Surgeon  Anesthesia Plan Comments:         Anesthesia Quick  Evaluation

## 2017-11-24 NOTE — Op Note (Signed)
Procedure: 1.  Flexible cystoscopy. 2.  Left inguinal exploration with excision of left paratesticular mass.  Preop diagnosis: 1.  Lower urinary tract symptoms. 2.  Left epididymal mass.  Postop diagnosis: 1.  Lower urinary tract symptoms with a small minimally obstructing prostate. 2.  Left adenomatoid tumor.  Surgeon: Dr. Irine Seal.  Anesthesia: General.  Specimen: Left paratesticular mass with frozen section diagnosis of adenomatoid tumor.  Drain: None.  EBL: Minimal.  Complications: None.  Indications: Darren Burns is a 42 year old Hispanic male who presented with a 1.5 cm left scrotal mass that was solid and paratesticular on ultrasound assessment and felt to require biopsy via an inguinal approach.  He also has lower urinary tract symptoms and cystoscopy was felt to be indicated.  Procedure: He was given 2 g of Ancef.  A general anesthetic was induced with him in the supine position.  PAS hose were placed.  His lower abdomen and genitalia were clipped.  He was prepped with Betadine solution and draped in usual sterile fashion.  Flexible cystoscopy was performed.  Examination revealed a normal urethra.  The external sphincter was intact.  The prostatic urethra was approximately 2 cm in length with minimal lateral lobe hyperplasia without significant obstruction.  Examination of the bladder revealed mild trabeculation but no tumor stones or inflammation were noted.  Ureteral orifices were unremarkable.  Left low inguinal incision was then made approximately 5 cm in length along the skin lines with a knife.  The subcutaneous tissues were incised with the Bovie and a Gelpi retractor was used for exposure.  The external oblique fascia was identified just above the level of the external ring and incised along the orientation of the fibers with a knife.  The fascial edges were grasped with hemostats and elevated and the fascia was then opened down to the external ring.  Ilioinguinal nerve was  identified and protected.  The spermatic cord was dissected out and doubly encircled with 1/4 inch Penrose and the testicle was delivered into the wound.  The gubernacular attachments were taken down using the Bovie.  The 1.5 cm mass was readily apparent adjacent to the tail of the epididymis but it did not appear to be attached to the epididymis.  It was whitish in appearance and smooth.  The lesion was dissected out using the Bovie and sent for frozen section.  Frozen section diagnosis was consistent with an adenomatoid tumor which is considered benign.  The testicle was then delivered back into the left hemiscrotum after hemostasis was achieved and 2 mL of 50-50 quarter percent Marcaine and 2% lidocaine were injected into the cord.  The external oblique fascia was then closed using a running 3-0 Vicryl suture with great care being taken to avoid entrapment of the ilioinguinal nerve.  The wound was irrigated.  The subcutaneous tissues were reapproximated using interrupted 3-0 chromic sutures.  The skin was closed with an intracuticular 4-0 Monocryl.  Wound was reinforced with Dermabond.  A dressing was applied followed by a scrotal support with 4 x 4's and fluff Kerlix.  His anesthetic was reversed and he was moved to recovery in stable condition.   there were no complications.

## 2017-11-24 NOTE — Progress Notes (Signed)
Received in Phase 2 from [facility], RN. Ambulated to Bathroom and voided without difficulty, place in recliner and eating crackers and drinking sprite.

## 2017-11-24 NOTE — Anesthesia Procedure Notes (Signed)
Procedure Name: Intubation Date/Time: 11/24/2017 10:30 AM Performed by: Lissa Morales, CRNA Pre-anesthesia Checklist: Patient identified, Emergency Drugs available, Suction available and Patient being monitored Patient Re-evaluated:Patient Re-evaluated prior to induction Oxygen Delivery Method: Circle system utilized Preoxygenation: Pre-oxygenation with 100% oxygen Induction Type: IV induction Ventilation: Mask ventilation without difficulty Laryngoscope Size: Mac and 4 Grade View: Grade II Tube type: Oral Tube size: 7.5 mm Number of attempts: 1 Airway Equipment and Method: Stylet and Oral airway Placement Confirmation: ETT inserted through vocal cords under direct vision,  positive ETCO2 and breath sounds checked- equal and bilateral Secured at: 22 cm Tube secured with: Tape Dental Injury: Teeth and Oropharynx as per pre-operative assessment

## 2017-11-24 NOTE — Anesthesia Postprocedure Evaluation (Signed)
Anesthesia Post Note  Patient: Darren Burns  Procedure(s) Performed: FLEX CYSTOSCOPY (N/A ) LEFT INGUINAL EXPLORATION WITH EXCISION OF EPIDYIMAL MASS (Left )     Patient location during evaluation: PACU Anesthesia Type: General Level of consciousness: awake and alert, oriented and patient cooperative Pain management: pain level controlled Vital Signs Assessment: post-procedure vital signs reviewed and stable Respiratory status: spontaneous breathing, nonlabored ventilation and respiratory function stable Cardiovascular status: blood pressure returned to baseline and stable Postop Assessment: no apparent nausea or vomiting Anesthetic complications: no    Last Vitals:  Vitals:   11/24/17 1245 11/24/17 1315  BP: (!) 125/92 (!) 130/97  Pulse: 72 68  Resp: 14 16  Temp: 36.8 C 37.2 C  SpO2: 97%     Last Pain:  Vitals:   11/24/17 1315  TempSrc:   PainSc: 2                  Aspin Palomarez,E. Izrael Peak

## 2017-11-24 NOTE — Interval H&P Note (Signed)
History and Physical Interval Note:  He doesn't want a vasectomy on the right side so that will not be done.    11/24/2017 9:43 AM  Darren Burns  has presented today for surgery, with the diagnosis of LEFT EPIDIDYMAL MASS, ELECTIVE STERILIZATION, BENIGN PROSTATE HYPERPLASIA  The various methods of treatment have been discussed with the patient and family. After consideration of risks, benefits and other options for treatment, the patient has consented to  Procedure(s): FLEX CYSTOSCOPY (N/A) LEFT INGUINAL EXPLORATION WITH EXCISION OF EPIDYIMAL MASS (Left) RIGHT VASECTOMY (Right) POSSIBLE LEFT RADICAL ORCHIECTOMY (Left) as a surgical intervention .  The patient's history has been reviewed, patient examined, no change in status, stable for surgery.  I have reviewed the patient's chart and labs.  Questions were answered to the patient's satisfaction.     Irine Seal

## 2017-11-25 ENCOUNTER — Encounter (HOSPITAL_COMMUNITY): Payer: Self-pay | Admitting: Urology

## 2017-12-01 ENCOUNTER — Telehealth: Payer: Self-pay

## 2017-12-01 NOTE — Telephone Encounter (Signed)
Reported to pt that his biopsy results were benign and explained what that means.  Pt said the pain decreases daily and now he has none, just "a little pinch" at times.  Pt agrees that he will call MD for signs that nurse listed.  Discussed tiredness is normal after a procedure.

## 2017-12-16 ENCOUNTER — Ambulatory Visit (INDEPENDENT_AMBULATORY_CARE_PROVIDER_SITE_OTHER): Payer: Self-pay | Admitting: Urology

## 2017-12-16 DIAGNOSIS — D2930 Benign neoplasm of unspecified epididymis: Secondary | ICD-10-CM

## 2018-01-02 ENCOUNTER — Encounter: Payer: Self-pay | Admitting: Family Medicine

## 2018-01-02 ENCOUNTER — Ambulatory Visit: Payer: Self-pay | Attending: Family Medicine | Admitting: Family Medicine

## 2018-01-02 VITALS — BP 130/80 | HR 98 | Temp 97.8°F | Ht 67.0 in | Wt 195.2 lb

## 2018-01-02 DIAGNOSIS — Z9889 Other specified postprocedural states: Secondary | ICD-10-CM | POA: Insufficient documentation

## 2018-01-02 DIAGNOSIS — Z79899 Other long term (current) drug therapy: Secondary | ICD-10-CM | POA: Insufficient documentation

## 2018-01-02 DIAGNOSIS — K219 Gastro-esophageal reflux disease without esophagitis: Secondary | ICD-10-CM | POA: Insufficient documentation

## 2018-01-02 DIAGNOSIS — Z886 Allergy status to analgesic agent status: Secondary | ICD-10-CM | POA: Insufficient documentation

## 2018-01-02 DIAGNOSIS — Z23 Encounter for immunization: Secondary | ICD-10-CM

## 2018-01-02 DIAGNOSIS — B351 Tinea unguium: Secondary | ICD-10-CM | POA: Insufficient documentation

## 2018-01-02 MED ORDER — TERBINAFINE HCL 250 MG PO TABS: 250.0000 mg | ORAL_TABLET | Freq: Every day | ORAL | 1 refills | Status: DC

## 2018-01-02 MED ORDER — OMEPRAZOLE 20 MG PO CPDR: 20.0000 mg | DELAYED_RELEASE_CAPSULE | Freq: Two times a day (BID) | ORAL | 3 refills | Status: DC

## 2018-01-02 NOTE — Progress Notes (Signed)
Subjective:  Patient ID: Darren Burns, male    DOB: April 20, 1975  Age: 42 y.o. MRN: [id number]  CC: Gastroesophageal Reflux   HPI Darren Burns  is a 42 year old obese with a history of GERD who presents today for a follow-up visit. Since his last office visit with me he has been treated for Helicobacter pylori which was diagnosed 5 months ago and he has been compliant with omeprazole which relieves his GI symptoms. He also had a visit to cardiology due to chest pains and exercise tolerance test was negative. Last month he underwent flexible cystoscopy with excision of left epididymal mass; pathology was benign.Reports doing well. He is less stressed today as he has quit his other job but now has one job. He has a concern about a left toenail fungus which has been present for several years and is embarrassing to him and he was told several years ago he would need an antifungal but this would cost up to $700 and he could not afford this.   Past Medical History:  Diagnosis Date  . Epididymal mass    left  . Frequency of urination   . GERD (gastroesophageal reflux disease)   . History of exercise intolerance 10/2017   negative ETT, no ischemia  . History of Helicobacter pylori infection 07/2017    Past Surgical History:  Procedure Laterality Date  . CYSTOSCOPY N/A 11/24/2017   Procedure: FLEX CYSTOSCOPY;  Surgeon: Irine Seal, MD;  Location: [facility];  Service: Urology;  Laterality: N/A;  . NO PAST SURGERIES    . SCROTAL EXPLORATION Left 11/24/2017   Procedure: LEFT INGUINAL EXPLORATION WITH EXCISION OF EPIDYIMAL MASS;  Surgeon: Irine Seal, MD;  Location: [facility];  Service: Urology;  Laterality: Left;    Allergies  Allergen Reactions  . Ibuprofen Swelling and Other (See Comments)  . Tamiflu [Oseltamivir Phosphate] Nausea And Vomiting     Outpatient Medications Prior to Visit  Medication Sig Dispense Refill  . omeprazole (PRILOSEC) 20 MG capsule Take 1  capsule (20 mg total) by mouth 2 (two) times daily before a meal. (Patient taking differently: Take 20 mg by mouth 2 (two) times daily as needed. ) 60 capsule 3  . acetaminophen (TYLENOL) 500 MG tablet Take 1,000 mg by mouth every 6 (six) hours as needed for moderate pain or headache.    Marland Kitchen HYDROcodone-acetaminophen (NORCO/VICODIN) 5-325 MG tablet Take 1 tablet by mouth every 6 (six) hours as needed for moderate pain. (Patient not taking: Reported on 01/02/2018) 8 tablet 0  . polyethylene glycol powder (GLYCOLAX/MIRALAX) powder Take 17 g by mouth daily. (Patient not taking: Reported on 01/02/2018) 3350 g 1   No facility-administered medications prior to visit.     ROS Review of Systems  Constitutional: Negative for activity change and appetite change.  HENT: Negative for sinus pressure and sore throat.   Eyes: Negative for visual disturbance.  Respiratory: Negative for cough, chest tightness and shortness of breath.   Cardiovascular: Negative for chest pain and leg swelling.  Gastrointestinal: Negative for abdominal distention, abdominal pain, constipation and diarrhea.  Endocrine: Negative.   Genitourinary: Negative for dysuria.  Musculoskeletal: Negative for joint swelling and myalgias.  Skin: Negative for rash.  Allergic/Immunologic: Negative.   Neurological: Negative for weakness, light-headedness and numbness.  Psychiatric/Behavioral: Negative for dysphoric mood and suicidal ideas.    Objective:  BP 130/80   Pulse 98   Temp 97.8 F (36.6 C) (Oral)   Ht 5' 7"  (1.702 m)   Wt 195 lb  3.2 oz (88.5 kg)   SpO2 97%   BMI 30.57 kg/m   BP/Weight 01/02/2018 44/11/2017 00/02/8095  Systolic BP 44 925 241  Diastolic BP 80 97 97  Wt. (Lbs) 195.2 195.38 195.38  BMI 30.57 32.51 32.51      Physical Exam  Constitutional: He is oriented to person, place, and time. He appears well-developed and well-nourished.  Cardiovascular: Normal rate, normal heart sounds and intact distal pulses.    No murmur heard. Pulmonary/Chest: Effort normal and breath sounds normal. He has no wheezes. He has no rales. He exhibits no tenderness.  Abdominal: Soft. Bowel sounds are normal. He exhibits no distension and no mass. There is no tenderness.  Musculoskeletal: Normal range of motion.  Neurological: He is alert and oriented to person, place, and time.  Skin: Skin is warm and dry.  Left big toenail onychomycosis  Psychiatric: He has a normal mood and affect.     Assessment & Plan:   1. Gastroesophageal reflux disease without esophagitis Controlled - omeprazole (PRILOSEC) 20 MG capsule; Take 1 capsule (20 mg total) by mouth 2 (two) times daily before a meal.  Dispense: 60 capsule; Refill: 3  2. Onychomycosis We will treat with oral antifungal first and then referred to podiatry at his next visit if he so desires for removal of the nail - terbinafine (LAMISIL) 250 MG tablet; Take 1 tablet (250 mg total) by mouth daily.  Dispense: 30 tablet; Refill: 1 - [id number]+EGFR   Meds ordered this encounter  Medications  . omeprazole (PRILOSEC) 20 MG capsule    Sig: Take 1 capsule (20 mg total) by mouth 2 (two) times daily before a meal.    Dispense:  60 capsule    Refill:  3  . terbinafine (LAMISIL) 250 MG tablet    Sig: Take 1 tablet (250 mg total) by mouth daily.    Dispense:  30 tablet    Refill:  1    Follow-up: Return in about 3 months (around 04/04/2018) for Follow-up on nail infection.   Charlott Rakes MD

## 2018-01-02 NOTE — Patient Instructions (Signed)

## 2018-01-03 LAB — CMP14+EGFR
ALBUMIN: 4.3 g/dL (ref 3.5–5.5)
ALK PHOS: 102 IU/L (ref 39–117)
ALT: 37 IU/L (ref 0–44)
AST: 26 IU/L (ref 0–40)
Albumin/Globulin Ratio: 1.4 (ref 1.2–2.2)
BUN / CREAT RATIO: 16 (ref 9–20)
BUN: 15 mg/dL (ref 6–24)
Bilirubin Total: 0.2 mg/dL (ref 0.0–1.2)
CALCIUM: 9.7 mg/dL (ref 8.7–10.2)
CO2: 21 mmol/L (ref 20–29)
CREATININE: 0.92 mg/dL (ref 0.76–1.27)
Chloride: 100 mmol/L (ref 96–106)
GFR, EST AFRICAN AMERICAN: 117 mL/min/{1.73_m2} (ref >59)
GFR, EST NON AFRICAN AMERICAN: 101 mL/min/{1.73_m2} (ref >59)
Globulin, Total: 3.1 g/dL (ref 1.5–4.5)
Glucose: 80 mg/dL (ref 65–99)
Potassium: 4.3 mmol/L (ref 3.5–5.2)
Sodium: 139 mmol/L (ref 134–144)
TOTAL PROTEIN: 7.4 g/dL (ref 6.0–8.5)

## 2018-01-11 ENCOUNTER — Telehealth: Payer: Self-pay

## 2018-01-11 NOTE — Telephone Encounter (Signed)
-----   Message from Charlott Rakes, MD sent at 01/03/2018  1:53 PM EST ----- Please inform the patient that labs are normal. Thank you.

## 2018-01-11 NOTE — Telephone Encounter (Signed)
Patient was called and informed to contact office for lab results.   If patient returns phone call please inform patient of results below. 

## 2018-02-10 MED FILL — OMEPRAZOLE 20 MG CAPSULE DR: 20 | 30 days supply | Qty: 60 | Fill #0

## 2018-02-10 MED FILL — TERBINAFINE HCL 250 MG TAB: 250 | 30 days supply | Qty: 30 | Fill #0

## 2018-04-04 ENCOUNTER — Ambulatory Visit: Payer: Self-pay | Admitting: Family Medicine

## 2018-04-10 ENCOUNTER — Ambulatory Visit: Payer: Self-pay | Admitting: Family Medicine

## 2018-08-25 IMAGING — US US SCROTUM W/ DOPPLER COMPLETE
1 series · 14 of 25 positions shown · non-contrast
Comparison: None.

CLINICAL DATA: Spermatocele of epididymis

EXAM:
SCROTAL ULTRASOUND
DOPPLER ULTRASOUND OF THE TESTICLES
TECHNIQUE: Complete ultrasound examination of the testicles, epididymis, and
other scrotal structures was performed. Color and spectral Doppler
ultrasound were also utilized to evaluate blood flow to the
testicles.

[Series 1: us scrotum w/ doppler complete · 0.07mm/px · 14 of 71 slices shown]
[im 1/71]
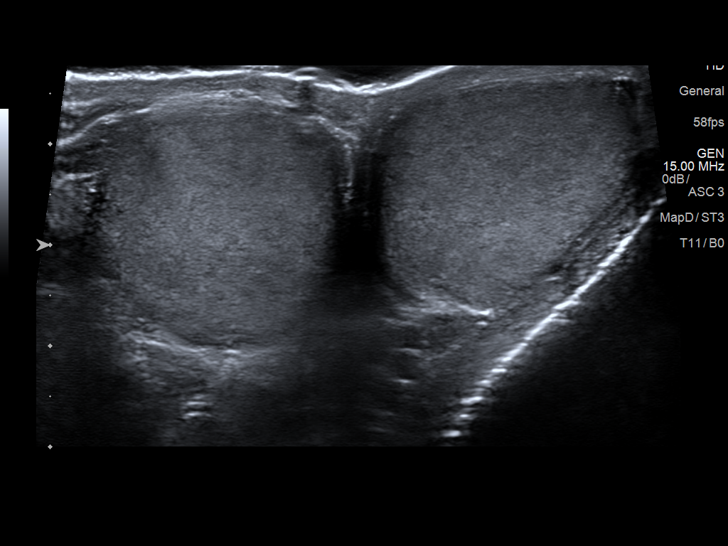
[im 6/71]
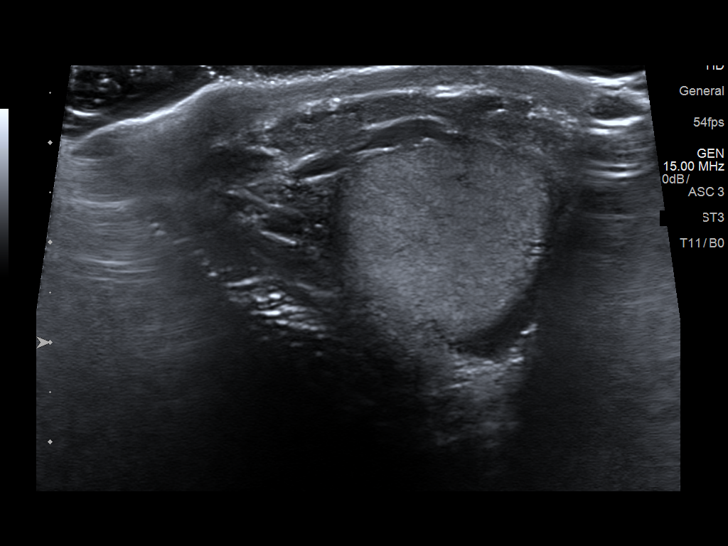
[im 12/71]
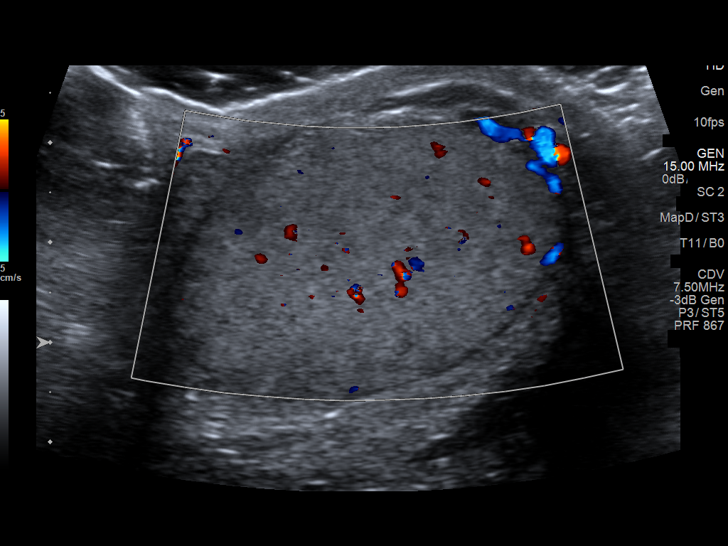
[im 18/71]
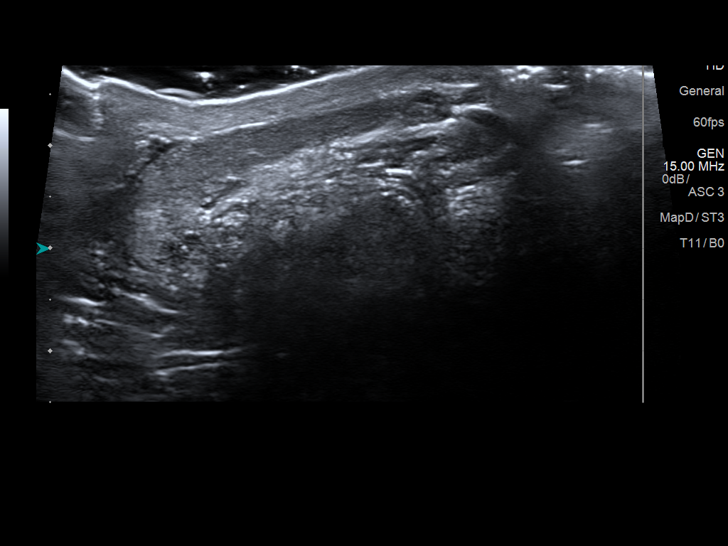
[im 24/71]
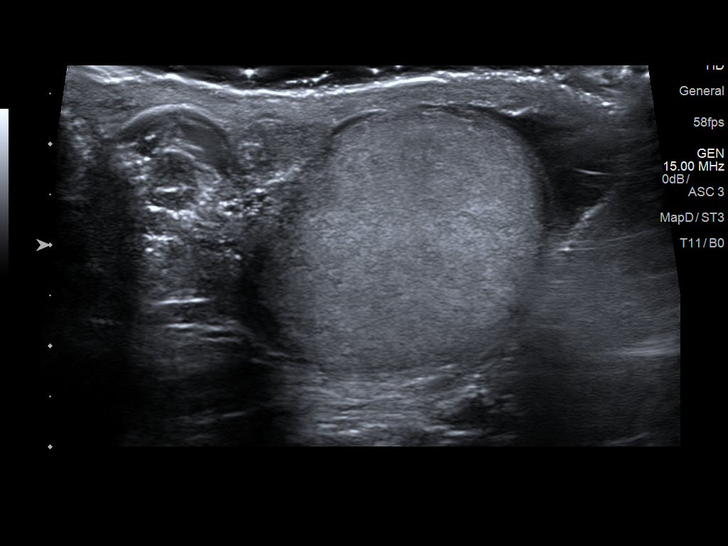
[im 27/71]
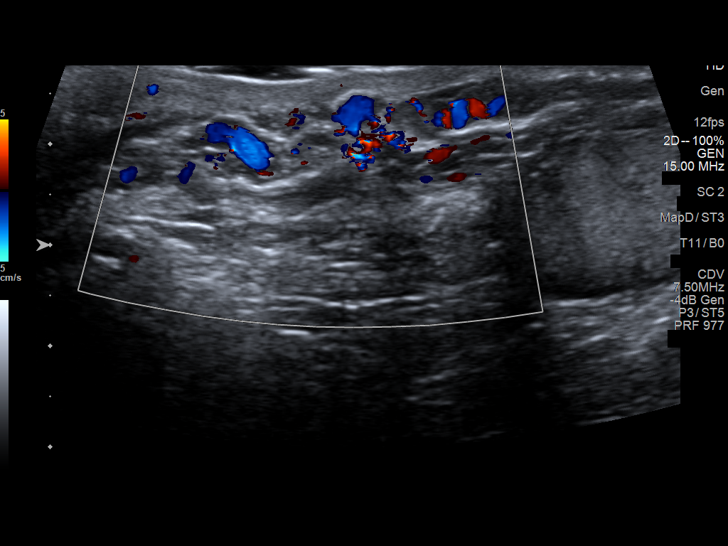
[im 33/71]
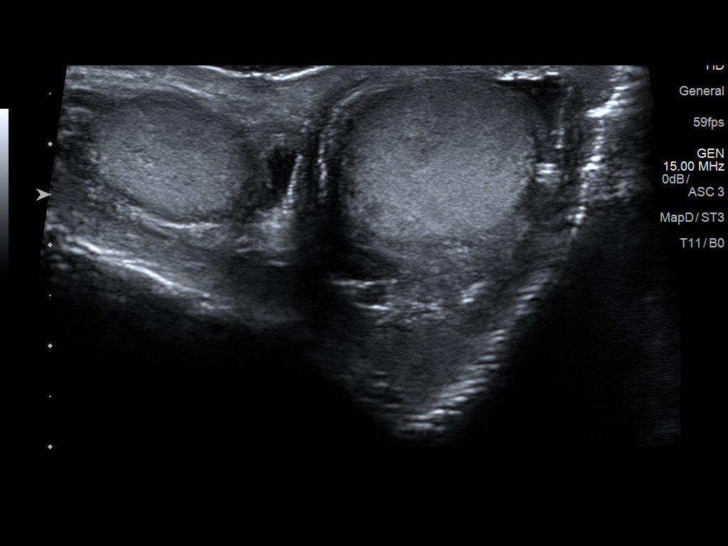
[im 38/71]
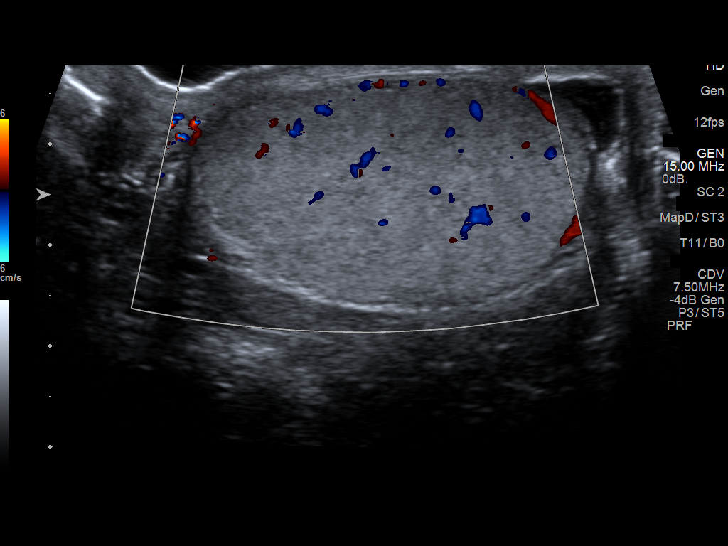
[im 44/71]
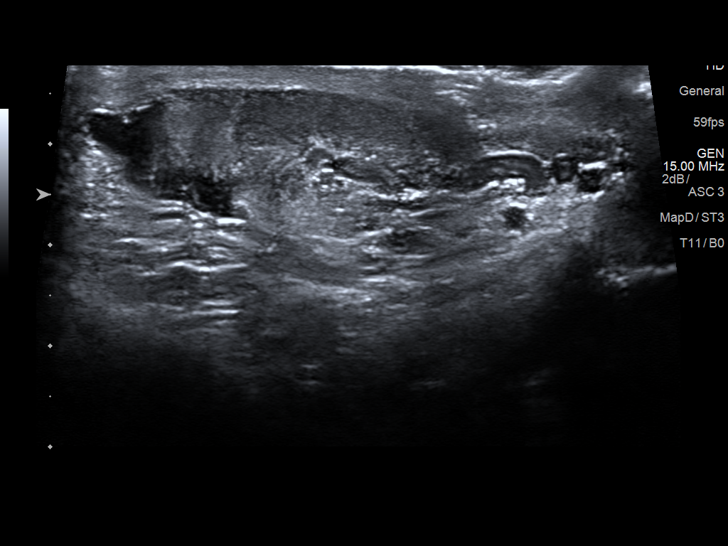
[im 47/71]
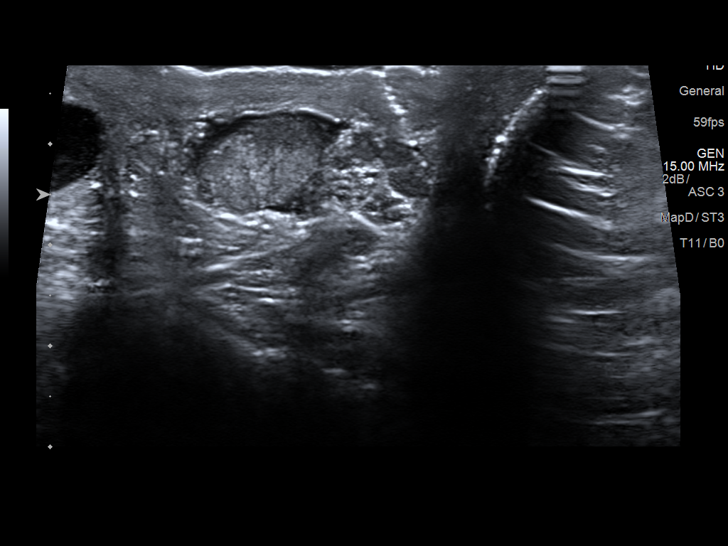
[im 53/71]
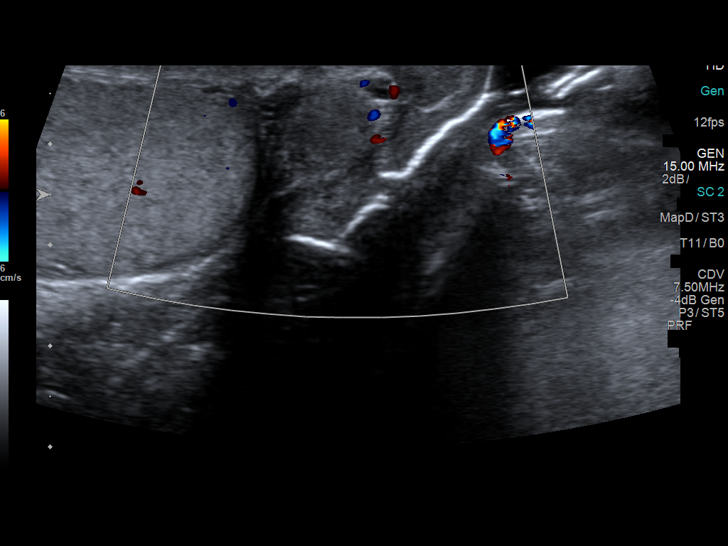
[im 59/71]
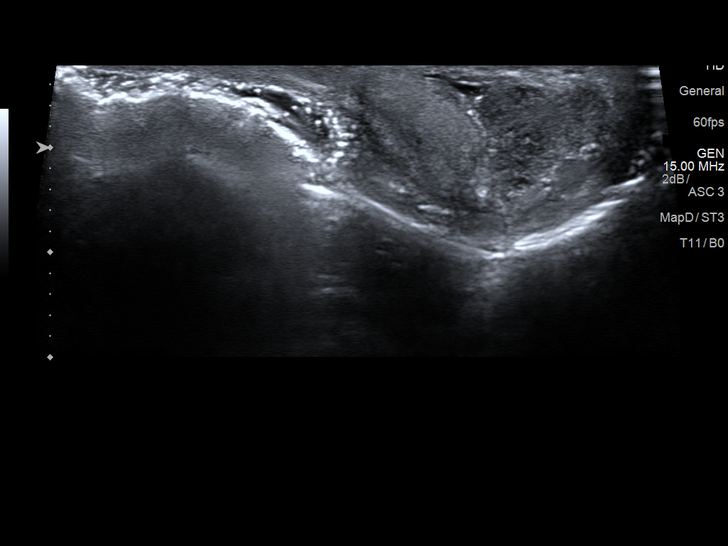
[im 65/71]
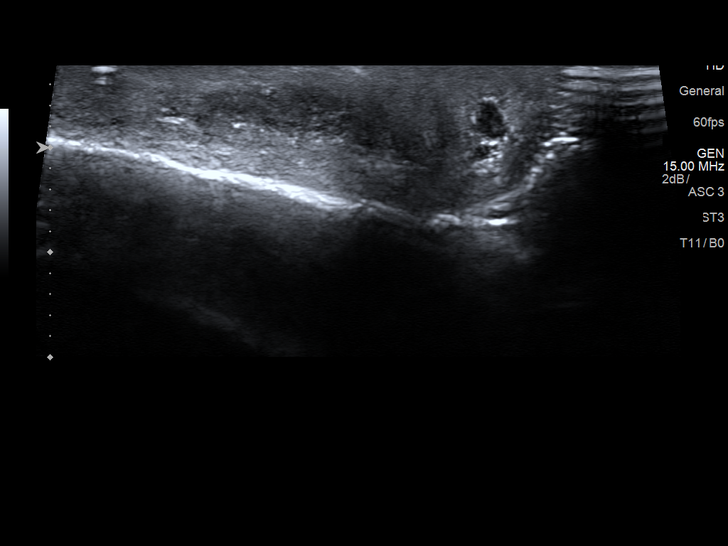
[im 71/71]
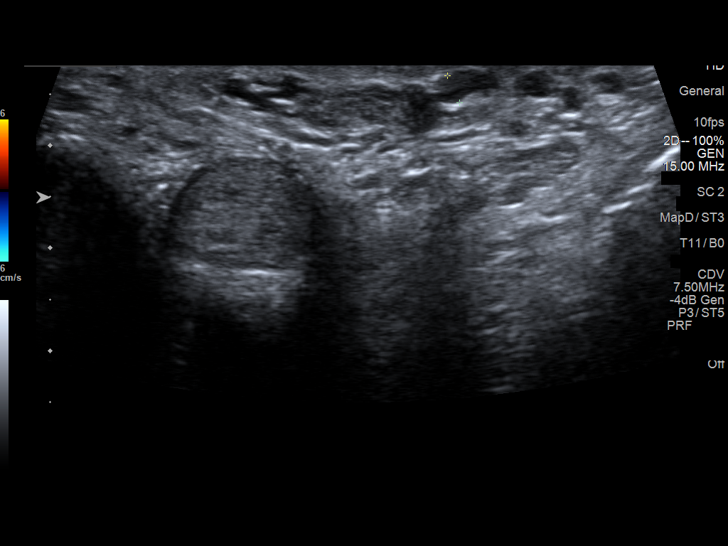

[14 of 25 positions shown; findings below may reference images not displayed]

FINDINGS: Right testicle

Measurements: 4.2 x 2.6 x 2.8 cm. No mass or microlithiasis
visualized.

Left testicle

Measurements: 2.5 x 2.1 x 4.0 cm. No mass or microlithiasis
visualized.

Right epididymis:  Normal in size and appearance.

Left epididymis: 13 mm hyperechoic nodule in the epididymal tail.
This has homogeneous hyperechoic features on ultrasound and is not a
cyst.

Hydrocele:  Small bilateral

Varicocele:  Right-sided varicocele

Pulsed Doppler interrogation of both testes demonstrates normal low
resistance arterial and venous waveforms bilaterally.
IMPRESSION: 13 mm solid hyperechoic mass in the tail of the left epididymis.
Differential diagnosis includes lipoma and neoplasm. Urologic
evaluation suggested.

Small bilateral hydroceles.  Right-sided varicocele.

Negative for testicular mass.

## 2018-11-15 ENCOUNTER — Emergency Department (HOSPITAL_COMMUNITY)
Admission: EM | Admit: 2018-11-15 | Discharge: 2018-11-15 | Disposition: A | Discharge status: Home / Self Care | Payer: No Typology Code available for payment source | Attending: Emergency Medicine | Admitting: Emergency Medicine

## 2018-11-15 ENCOUNTER — Other Ambulatory Visit: Payer: Self-pay

## 2018-11-15 ENCOUNTER — Encounter (HOSPITAL_COMMUNITY): Payer: Self-pay | Admitting: *Deleted

## 2018-11-15 DIAGNOSIS — Z87891 Personal history of nicotine dependence: Secondary | ICD-10-CM | POA: Insufficient documentation

## 2018-11-15 DIAGNOSIS — H539 Unspecified visual disturbance: Secondary | ICD-10-CM

## 2018-11-15 DIAGNOSIS — H538 Other visual disturbances: Secondary | ICD-10-CM | POA: Insufficient documentation

## 2018-11-15 DIAGNOSIS — H5711 Ocular pain, right eye: Secondary | ICD-10-CM

## 2018-11-15 MED ORDER — TETRACAINE HCL 0.5 % OP SOLN
2.0000 [drp] | Freq: Once | OPHTHALMIC | Status: AC
  Administered 2018-11-15: 2 [drp] via OPHTHALMIC
  Filled 2018-11-15: qty 4

## 2018-11-15 MED ORDER — FLUORESCEIN SODIUM 1 MG OP STRP
1.0000 | ORAL_STRIP | Freq: Once | OPHTHALMIC | Status: AC
  Administered 2018-11-15: 17:00:00 1 via OPHTHALMIC
  Filled 2018-11-15: qty 1

## 2018-11-15 NOTE — ED Triage Notes (Signed)
thge pt is c/o rt eye pain  He was struck in the eye by an object at work over one week ago  Has pain in his rt orbit  He sees a light in the rt eye

## 2018-11-15 NOTE — ED Notes (Signed)
Woods lamp and Tonopen at bedside.

## 2018-11-15 NOTE — Discharge Instructions (Signed)
Please meet Dr. Talbert Forest (ophthalmologist) at the address below at 8 PM tonight, call him at [phone] when you arrive so that he can open the door for you.

## 2018-11-15 NOTE — ED Notes (Signed)
Strip and Eye Drops at Bedside

## 2018-11-15 NOTE — ED Provider Notes (Signed)
[facility] EMERGENCY DEPARTMENT Provider Note   CSN: [id number] Arrival date & time: 11/15/18  1443     History   Chief Complaint Chief Complaint  Patient presents with  . Eye Problem    HPI Darren Burns is a 43 y.o. male.     Darren Burns is a 43 y.o. male with hx of GERDand H. Pylori, who presents to the ED for evaluation of right eye pain. Patient reports about a week ago while working with cables that snapped back hitting him in the right eye while it was open.  Patient reports that he immediately rinsed his eye out and then it seemed to feel better but over the past week he has had intermittent mild 2-3/10 pain in the right eye and has been seeing flashes of light in the outer visual fields with increasing frequency.  He denies any floaters or scotomas.  Denies any blurred vision or visual loss.  He denies any eye redness, swelling or drainage.  No fevers or chills.  No pain or swelling surrounding the eye.  He does not wear contacts or glasses.  Does not have an eye doctor that he sees regularly.  No other aggravating or alleviating factors.     Past Medical History:  Diagnosis Date  . Epididymal mass    left  . Frequency of urination   . GERD (gastroesophageal reflux disease)   . History of exercise intolerance 10/2017   negative ETT, no ischemia  . History of Helicobacter pylori infection 07/2017    Patient Active Problem List   Diagnosis Date Noted  . GERD (gastroesophageal reflux disease) 08/17/2016  . Chest pain 11/10/2014  . Pain in the chest   . Dental cavities 10/25/2013  . Annual physical exam 10/25/2013    Past Surgical History:  Procedure Laterality Date  . CYSTOSCOPY N/A 11/24/2017   Procedure: FLEX CYSTOSCOPY;  Surgeon: Irine Seal, MD;  Location: [facility];  Service: Urology;  Laterality: N/A;  . NO PAST SURGERIES    . SCROTAL EXPLORATION Left 11/24/2017   Procedure: LEFT INGUINAL EXPLORATION WITH  EXCISION OF EPIDYIMAL MASS;  Surgeon: Irine Seal, MD;  Location: [facility];  Service: Urology;  Laterality: Left;        Home Medications    Prior to Admission medications   Medication Sig Start Date End Date Taking? Authorizing Provider  acetaminophen (TYLENOL) 500 MG tablet Take 1,000 mg by mouth every 6 (six) hours as needed for moderate pain or headache.    [provider]  HYDROcodone-acetaminophen (NORCO/VICODIN) 5-325 MG tablet Take 1 tablet by mouth every 6 (six) hours as needed for moderate pain. Patient not taking: Reported on 01/02/2018 11/24/17 11/24/18  Irine Seal, MD  omeprazole (PRILOSEC) 20 MG capsule Take 1 capsule (20 mg total) by mouth 2 (two) times daily before a meal. 01/02/18   Charlott Rakes, MD  polyethylene glycol powder (GLYCOLAX/MIRALAX) powder Take 17 g by mouth daily. Patient not taking: Reported on 01/02/2018 11/26/16   Charlott Rakes, MD  terbinafine (LAMISIL) 250 MG tablet Take 1 tablet (250 mg total) by mouth daily. 01/02/18   Charlott Rakes, MD    Family History No family history on file.  Social History Social History   Tobacco Use  . Smoking status: Former Smoker    Years: 10.00    Types: Cigarettes    Quit date: 02/15/1998    Years since quitting: 20.7  . Smokeless tobacco: Never Used  Substance Use Topics  . Alcohol  use: No  . Drug use: Not Currently    Types: Cocaine, "Crack" cocaine, Marijuana    Comment: 11-22-2017 per pt last cocaine 2001     Allergies   Ibuprofen and Tamiflu [oseltamivir phosphate]   Review of Systems Review of Systems  Constitutional: Negative for chills and fever.  HENT: Negative for facial swelling.   Eyes: Positive for pain and visual disturbance. Negative for photophobia, discharge, redness and itching.  Skin: Negative for color change and wound.  Neurological: Negative for headaches.     Physical Exam Updated Vital Signs BP (!) 149/82 (BP Location: Right Arm)   Pulse 82   Temp 97.9 F  (36.6 C) (Oral)   Resp 20   Ht 5\' 5"  (1.651 m)   Wt 81.6 kg   SpO2 100%   BMI 29.95 kg/m   Physical Exam Vitals signs and nursing note reviewed.  Constitutional:      General: He is not in acute distress.    Appearance: Normal appearance. He is well-developed and normal weight. He is not ill-appearing or diaphoretic.  HENT:     Head: Normocephalic and atraumatic.  Eyes:     General:        Right eye: No discharge.        Left eye: No discharge.     Extraocular Movements: Extraocular movements intact.     Conjunctiva/sclera: Conjunctivae normal.     Pupils: Pupils are equal, round, and reactive to light.     Comments: Right eye with no periorbital swelling or erythema.  Conjunctivae appear normal without erythema or drainage.  PERRLA, EOMI, no consensual pain, no photophobia.  Fluorescein staining does not reveal any corneal abrasions or ulcers.  No Seidel sign.  Intraocular pressure of 22 mmHg. Left eye unremarkable. Normal visual acuity bilaterally  Pulmonary:     Effort: Pulmonary effort is normal. No respiratory distress.  Neurological:     Mental Status: He is alert.     Coordination: Coordination normal.  Psychiatric:        Behavior: Behavior normal.      ED Treatments / Results  Labs (all labs ordered are listed, but only abnormal results are displayed) Labs Reviewed - No data to display  EKG None  Radiology No results found.  Procedures Procedures (including critical care time)  Medications Ordered in ED Medications - No data to display   Initial Impression / Assessment and Plan / ED Course  I have reviewed the triage vital signs and the nursing notes.  Pertinent labs & imaging results that were available during my care of the patient were reviewed by me and considered in my medical decision making (see chart for details).  43 year old male presents with right eye pain and intermittent flashes of light in peripheral visual fields on the right.   These symptoms started after patient was hit in the eye with a cable at work 1 week ago.  On exam there is no obvious evidence of trauma to the eye, no swelling or redness.  Fluorescein staining reveals no evidence of corneal ulcer or abrasion.  Patient with normal visual acuity.  Normal eye pressures.  Given recent trauma with reported flashing light, there is some concern for possible retinal detachment.  Patient does not have light sensitivity or consensual pain to suggest traumatic iritis.  I discussed case with Dr. Talbert Forest with ophthalmology who will see the patient in his office this evening at 8 PM for further eye evaluation.  No further recommendations for  acute management from Dr. Talbert Forest currently.  Discussed this plan with the patient, provided address for follow-up this evening.  Patient expresses understanding.  Discharged home in good condition.   Final Clinical Impressions(s) / ED Diagnoses   Final diagnoses:  Eye pain, right  Visual changes    ED Discharge Orders    None       Janet Berlin 11/15/18 2121    Blanchie Dessert, MD 11/17/18 [phone]

## 2018-12-13 ENCOUNTER — Ambulatory Visit: Payer: No Typology Code available for payment source

## 2019-01-09 ENCOUNTER — Other Ambulatory Visit: Payer: Self-pay

## 2019-01-09 ENCOUNTER — Encounter: Payer: Self-pay | Admitting: Family Medicine

## 2019-01-09 ENCOUNTER — Ambulatory Visit: Payer: Self-pay | Attending: Family Medicine | Admitting: Family Medicine

## 2019-01-09 DIAGNOSIS — M545 Low back pain, unspecified: Secondary | ICD-10-CM

## 2019-01-09 DIAGNOSIS — B351 Tinea unguium: Secondary | ICD-10-CM

## 2019-01-09 DIAGNOSIS — M546 Pain in thoracic spine: Secondary | ICD-10-CM

## 2019-01-09 MED ORDER — TERBINAFINE HCL 250 MG PO TABS: 250.0000 mg | ORAL_TABLET | Freq: Every day | ORAL | 1 refills | Status: DC

## 2019-01-09 NOTE — Progress Notes (Signed)
Patient verified DOB Patient has eaten today. Patient has not taken medication. Patient denies pain at this time. Miralax refill requested and reorder lamisil. Fungus is still present in the nails and patient never picked up.

## 2019-01-09 NOTE — Progress Notes (Signed)
Virtual Visit via Telephone Note  I connected with Darren Burns, on 01/09/2019 at 3:38 PM by telephone due to the COVID-19 pandemic and verified that I am speaking with the correct person using two identifiers.   Consent: I discussed the limitations, risks, security and privacy concerns of performing an evaluation and management service by telephone and the availability of in person appointments. I also discussed with the patient that there may be a patient responsible charge related to this service. The patient expressed understanding and agreed to proceed.   Location of Patient: At work  Location of Provider: Clinic   Persons participating in Telemedicine visit: Darren Burns Dr. Margarita Rana     History of Present Illness: 43 year old right-handed male who is seen today for an acute visit.  One week ago he hurt his back on the job when he fell on the counter top with half of his spine off while his upper half was on it and heard a crack in his mid back. He has a 4/10 back pain and feels "something is not right" he further goes on to say this is a back discomfort and not really pain. When he stretches he feels something weird. States he does not feel his arms when he is asleep and has to move his arms for the feeling to return. He has also noticed slightly reduced strength but denies presence of numbness and he is not dropping things.  Tylenol provides mild relief and helps him sleep.  He also complains of urinary frequency and sometimes has to go once at night. He endorses > 2 drinks of coffee.  Denies presence of dysuria  He also needs a refill of Lamisil for his onychomycosis as he never picked up this prescription when it was written 1 year ago.  Past Medical History:  Diagnosis Date  . Epididymal mass    left  . Frequency of urination   . GERD (gastroesophageal reflux disease)   . History of exercise intolerance 10/2017   negative  ETT, no ischemia  . History of Helicobacter pylori infection 07/2017   Allergies  Allergen Reactions  . Ibuprofen Swelling and Other (See Comments)  . Tamiflu [Oseltamivir Phosphate] Nausea And Vomiting    Current Outpatient Medications on File Prior to Visit  Medication Sig Dispense Refill  . acetaminophen (TYLENOL) 500 MG tablet Take 1,000 mg by mouth every 6 (six) hours as needed for moderate pain or headache.    Marland Kitchen omeprazole (PRILOSEC) 20 MG capsule Take 1 capsule (20 mg total) by mouth 2 (two) times daily before a meal. 60 capsule 3  . polyethylene glycol powder (GLYCOLAX/MIRALAX) powder Take 17 g by mouth daily. 3350 g 1  . terbinafine (LAMISIL) 250 MG tablet Take 1 tablet (250 mg total) by mouth daily. 30 tablet 1   No current facility-administered medications on file prior to visit.     Observations/Objective: Awake, alert, oriented x3 Not in acute distress  Assessment and Plan: 1. Onychomycosis He never picked up prescription Discussed that this is not a long-term medication and he will just use this for the short while. - terbinafine (LAMISIL) 250 MG tablet; Take 1 tablet (250 mg total) by mouth daily.  Dispense: 30 tablet; Refill: 1  2. Thoracolumbar back pain Secondary to trauma He is satisfied with Tylenol and does not need additional medications We will proceed with x-rays of thoracic and lumbar spine and consider MRI if symptoms are uncontrolled Educated about warning signs, red flags which would necessitate  presentation to the ED - DG Thoracic Spine 2 View; Future - DG Lumbar Spine Complete; Future  Provided directions to radiology department   Follow Up Instructions: Return in about 2 weeks (around 01/23/2019), or if symptoms worsen or fail to improve, for Follow-up of back pain-impress.    I discussed the assessment and treatment plan with the patient. The patient was provided an opportunity to ask questions and all were answered. The patient agreed with  the plan and demonstrated an understanding of the instructions.   The patient was advised to call back or seek an in-person evaluation if the symptoms worsen or if the condition fails to improve as anticipated.     I provided 15 minutes total of non-face-to-face time during this encounter including median intraservice time, reviewing previous notes, labs, imaging, medications, management and patient verbalized understanding.     Charlott Rakes, MD, FAAFP. [facility] and [place], [place]   01/09/2019, 3:38 PM

## 2019-04-24 ENCOUNTER — Other Ambulatory Visit: Payer: Self-pay

## 2019-04-24 ENCOUNTER — Telehealth: Payer: Self-pay | Admitting: Family Medicine

## 2019-04-24 ENCOUNTER — Ambulatory Visit: Payer: Self-pay | Attending: Family Medicine

## 2019-04-24 MED ORDER — ALBUTEROL SULFATE HFA 108 (90 BASE) MCG/ACT IN AERS: 2.0000 | INHALATION_SPRAY | RESPIRATORY_TRACT | 1 refills | Status: DC | PRN

## 2019-04-24 MED FILL — ALBUTEROL SULFATE HFA 108 (: 108 (90 BAS | 16 days supply | Qty: 18 | Fill #0

## 2019-04-24 NOTE — Telephone Encounter (Signed)
Patient requested for his albuterol inhaler to be refilled. Patient requested for it to be sent to [facility] pharmacy.

## 2019-04-24 NOTE — Telephone Encounter (Signed)
Medication has been refilled and sent to pharmacy  

## 2019-04-26 ENCOUNTER — Other Ambulatory Visit: Payer: Self-pay

## 2019-04-26 ENCOUNTER — Encounter (HOSPITAL_COMMUNITY): Payer: Self-pay

## 2019-04-26 ENCOUNTER — Ambulatory Visit: Payer: Self-pay | Attending: Family Medicine | Admitting: Physician Assistant

## 2019-04-26 ENCOUNTER — Emergency Department (HOSPITAL_COMMUNITY)
Admission: EM | Admit: 2019-04-26 | Discharge: 2019-04-26 | Disposition: A | Discharge status: Home / Self Care | Payer: Self-pay | Attending: Emergency Medicine | Admitting: Emergency Medicine

## 2019-04-26 ENCOUNTER — Emergency Department (HOSPITAL_COMMUNITY): Payer: Self-pay

## 2019-04-26 VITALS — BP 151/99 | HR 80 | Temp 98.1°F | Resp 15 | Ht 65.0 in | Wt 197.0 lb

## 2019-04-26 DIAGNOSIS — M79672 Pain in left foot: Secondary | ICD-10-CM

## 2019-04-26 DIAGNOSIS — Y939 Activity, unspecified: Secondary | ICD-10-CM | POA: Insufficient documentation

## 2019-04-26 DIAGNOSIS — H524 Presbyopia: Secondary | ICD-10-CM

## 2019-04-26 DIAGNOSIS — Y999 Unspecified external cause status: Secondary | ICD-10-CM | POA: Insufficient documentation

## 2019-04-26 DIAGNOSIS — Z13220 Encounter for screening for lipoid disorders: Secondary | ICD-10-CM

## 2019-04-26 DIAGNOSIS — M546 Pain in thoracic spine: Secondary | ICD-10-CM | POA: Insufficient documentation

## 2019-04-26 DIAGNOSIS — X509XXA Other and unspecified overexertion or strenuous movements or postures, initial encounter: Secondary | ICD-10-CM | POA: Insufficient documentation

## 2019-04-26 DIAGNOSIS — Z131 Encounter for screening for diabetes mellitus: Secondary | ICD-10-CM

## 2019-04-26 DIAGNOSIS — R030 Elevated blood-pressure reading, without diagnosis of hypertension: Secondary | ICD-10-CM

## 2019-04-26 DIAGNOSIS — S93402A Sprain of unspecified ligament of left ankle, initial encounter: Secondary | ICD-10-CM | POA: Insufficient documentation

## 2019-04-26 DIAGNOSIS — Y929 Unspecified place or not applicable: Secondary | ICD-10-CM | POA: Insufficient documentation

## 2019-04-26 DIAGNOSIS — R202 Paresthesia of skin: Secondary | ICD-10-CM

## 2019-04-26 DIAGNOSIS — G5603 Carpal tunnel syndrome, bilateral upper limbs: Secondary | ICD-10-CM

## 2019-04-26 NOTE — ED Provider Notes (Signed)
[facility] EMERGENCY DEPARTMENT Provider Note   CSN: [id number] Arrival date & time: 04/26/19  1734     History Chief Complaint  Patient presents with  . Back Pain  . Foot Pain  . Leg Pain    Darren Burns is a 44 y.o. male.  The history is provided by the patient and medical records. No language interpreter was used.  Back Pain Associated symptoms: leg pain   Associated symptoms: no numbness   Foot Pain  Leg Pain Associated symptoms: back pain      44 year old male presenting requesting for x-ray of his back and his left ankle.  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  Since then he has had intermittent pain to his mid back which he described as "bone grinding on bone" worse with certain positional change and is waxing and waning.  Pain is nonradiating.  At this time pain is minimal.  No associated chest pain trouble breathing or abdominal pain or lower back pain.  He also report twisting his left ankle at least 3-4 times within the past few months while walking.  No complaints of leg weakness or numbness. Also complain of pain to the lateral aspect of the ankle as well.  He report pain is minimal at this time and no recent injury.  He did discuss this finding with his primary care provider who apparently ordered future x-rays in the affected area.  It has not been done yet and he is here per request to have these x-rays obtained.  No associated numbness, or radicular pain.  He denies any focal or weakness..  Past Medical History:  Diagnosis Date  . Epididymal mass    left  . Frequency of urination   . GERD (gastroesophageal reflux disease)   . History of exercise intolerance 10/2017   negative ETT, no ischemia  . History of Helicobacter pylori infection 07/2017    Patient Active Problem List   Diagnosis Date Noted  .  GERD (gastroesophageal reflux disease) 08/17/2016  . Chest pain 11/10/2014  . Pain in the chest   . Dental cavities 10/25/2013  . Annual physical exam 10/25/2013    Past Surgical History:  Procedure Laterality Date  . CYSTOSCOPY N/A 11/24/2017   Procedure: FLEX CYSTOSCOPY;  Surgeon: Irine Seal, MD;  Location: [facility];  Service: Urology;  Laterality: N/A;  . NO PAST SURGERIES    . SCROTAL EXPLORATION Left 11/24/2017   Procedure: LEFT INGUINAL EXPLORATION WITH EXCISION OF EPIDYIMAL MASS;  Surgeon: Irine Seal, MD;  Location: [facility];  Service: Urology;  Laterality: Left;       No family history on file.  Social History   Tobacco Use  . Smoking status: Former Smoker    Years: 10.00    Types: Cigarettes    Quit date: 02/15/1998    Years since quitting: 21.2  . Smokeless tobacco: Never Used  Substance Use Topics  . Alcohol use: No  . Drug use: Not Currently    Types: Cocaine, "Crack" cocaine, Marijuana    Comment: 11-22-2017 per pt last cocaine 2001    Home Medications Prior to Admission medications   Medication Sig Start Date End Date Taking? Authorizing Provider  acetaminophen (TYLENOL) 500 MG tablet Take 1,000 mg by mouth every 6 (six) hours as needed for moderate pain or headache.    [provider]  albuterol (VENTOLIN HFA) 108 (90 Base) MCG/ACT inhaler Inhale 2 puffs into the lungs every 4 (four) hours as needed for wheezing or shortness of breath. Patient not taking: Reported on 04/26/2019 04/24/19   Charlott Rakes, MD  omeprazole (PRILOSEC) 20 MG capsule Take 1 capsule (20 mg total) by mouth 2 (two) times daily before a meal. Patient not taking: Reported on 04/26/2019 01/02/18   Charlott Rakes, MD  polyethylene glycol powder (GLYCOLAX/MIRALAX) powder Take 17 g by mouth daily. Patient not taking: Reported on 04/26/2019 11/26/16   Charlott Rakes, MD  terbinafine (LAMISIL) 250 MG tablet Take 1 tablet (250 mg total) by mouth daily. Patient not taking: Reported on  04/26/2019 01/09/19   Charlott Rakes, MD    Allergies    Ibuprofen and Tamiflu [oseltamivir phosphate]  Review of Systems   Review of Systems  Musculoskeletal: Positive for back pain.  Skin: Negative for wound.  Neurological: Negative for numbness.    Physical Exam Updated Vital Signs BP (!) 157/113 (BP Location: Left Arm)   Pulse 77   Temp 98.4 F (36.9 C) (Oral)   Resp 17   Ht 5\' 5"  (1.651 m)   Wt 86.2 kg   SpO2 99%   BMI 31.62 kg/m   Physical Exam Vitals and nursing note reviewed.  Constitutional:      General: He is not in acute distress.    Appearance: He is well-developed.  HENT:     Head: Atraumatic.  Eyes:     Conjunctiva/sclera: Conjunctivae normal.  Musculoskeletal:        General: Tenderness (Mild tenderness to midline/thoracic spine at the level of T3/T4 without any crepitus or step-off.  No overlying skin changes.  Tenderness to lateral malleoli region of the left ankle with normal ankle range of motion intact dorsalis pedis and brisk cap ref) present.     Cervical back: Neck supple.  Skin:    Findings: No rash.  Neurological:     Mental Status: He is alert.     ED Results / Procedures / Treatments   Labs (all labs ordered are listed, but only abnormal results are displayed) Labs Reviewed - No data to display  EKG None  Radiology DG Thoracic Spine 2 View  Result Date: 04/26/2019 CLINICAL DATA:  Chronic back pain EXAM: THORACIC SPINE 2 VIEWS COMPARISON:  None. FINDINGS: There is no evidence of thoracic spine fracture. Alignment is normal. No other significant bone abnormalities are identified. IMPRESSION: Negative. Electronically Signed   By: Macy Mis M.D.   On: 04/26/2019 20:02   DG Ankle Complete Left  Result Date: 04/26/2019 CLINICAL DATA:  Ankle injury EXAM: LEFT ANKLE COMPLETE - 3+ VIEW COMPARISON:  None. FINDINGS: There is no evidence of fracture, dislocation, or joint effusion. There is no evidence of arthropathy or other focal bone  abnormality. Soft tissues are unremarkable. IMPRESSION: Negative. Electronically Signed   By: Donavan Foil M.D.   On: 04/26/2019 19:59    Procedures Procedures (including critical care time)  Medications Ordered in ED Medications - No data to display  ED Course  I have reviewed the triage vital signs and the nursing notes.  Pertinent labs & imaging results that were available during my care of the patient were reviewed by me and considered in my medical decision making (see chart for details).    MDM Rules/Calculators/A&P                      BP (!) 157/113 (BP Location: Left  Arm)   Pulse 77   Temp 98.4 F (36.9 C) (Oral)   Resp 17   Ht 5\' 5"  (1.651 m)   Wt 86.2 kg   SpO2 99%   BMI 31.62 kg/m   Final Clinical Impression(s) / ED Diagnoses Final diagnoses:  Acute thoracic back pain, unspecified back pain laterality  Sprain of left ankle, unspecified ligament, initial encounter    Rx / DC Orders ED Discharge Orders    None     7:29 PM Patient here with complaints of pain to his mid back as well as his left ankle from prior injuries.  No recent injury but requesting for x-ray that his doctor has reordered.  I did review his previous office visit notes and will obtain thoracic x-ray as well as left ankle x-ray.  8:19 PM Xray of L ankle and thoracic spine are without acute changes.  I encourage pt to f/u with orthopedist or PCP for further care.  Otherwise, he is stable for discharge.    Domenic Moras, PA-C 04/26/19 2035    Lucrezia Starch, MD 05/01/19 [id number]

## 2019-04-26 NOTE — ED Triage Notes (Signed)
Pt reports chronic back pain for a month. States he cracked his back and has had pain ever since. Pt also c.o right leg and foot pain. Pt ambulatory.

## 2019-04-26 NOTE — Patient Instructions (Addendum)
Check blood pressure outside of the office 2-3 times weekly and bring to next appt  Carpal Tunnel Syndrome  Carpal tunnel syndrome is a condition that causes pain in your hand and arm. The carpal tunnel is a narrow area that is on the palm side of your wrist. Repeated wrist motion or certain diseases may cause swelling in the tunnel. This swelling can pinch the main nerve in the wrist (median nerve). What are the causes? This condition may be caused by:  Repeated wrist motions.  Wrist injuries.  Arthritis.  A sac of fluid (cyst) or abnormal growth (tumor) in the carpal tunnel.  Fluid buildup during pregnancy. Sometimes the cause is not known. What increases the risk? The following factors may make you more likely to develop this condition:  Having a job in which you move your wrist in the same way many times. This includes jobs like being a Software engineer or a Scientist, water quality.  Being a woman.  Having other health conditions, such as: ? Diabetes. ? Obesity. ? A thyroid gland that is not active enough (hypothyroidism). ? Kidney failure. What are the signs or symptoms? Symptoms of this condition include:  A tingling feeling in your fingers.  Tingling or a loss of feeling (numbness) in your hand.  Pain in your entire arm. This pain may get worse when you bend your wrist and elbow for a long time.  Pain in your wrist that goes up your arm to your shoulder.  Pain that goes down into your palm or fingers.  A weak feeling in your hands. You may find it hard to grab and hold items. You may feel worse at night. How is this diagnosed? This condition is diagnosed with a medical history and physical exam. You may also have tests, such as:  Electromyogram (EMG). This test checks the signals that the nerves send to the muscles.  Nerve conduction study. This test checks how well signals pass through your nerves.  Imaging tests, such as X-rays, ultrasound, and MRI. These tests check for what  might be the cause of your condition. How is this treated? This condition may be treated with:  Lifestyle changes. You will be asked to stop or change the activity that caused your problem.  Doing exercise and activities that make bones and muscles stronger (physical therapy).  Learning how to use your hand again (occupational therapy).  Medicines for pain and swelling (inflammation). You may have injections in your wrist.  A wrist splint.  Surgery. Follow these instructions at home: If you have a splint:  Wear the splint as told by your doctor. Remove it only as told by your doctor.  Loosen the splint if your fingers: ? Tingle. ? Lose feeling (become numb). ? Turn cold and blue.  Keep the splint clean.  If the splint is not waterproof: ? Do not let it get wet. ? Cover it with a watertight covering when you take a bath or a shower. Managing pain, stiffness, and swelling   If told, put ice on the painful area: ? If you have a removable splint, remove it as told by your doctor. ? Put ice in a plastic bag. ? Place a towel between your skin and the bag. ? Leave the ice on for 20 minutes, 2-3 times per day. General instructions  Take over-the-counter and prescription medicines only as told by your doctor.  Rest your wrist from any activity that may cause pain. If needed, talk with your boss at work about  changes that can help your wrist heal.  Do any exercises as told by your doctor, physical therapist, or occupational therapist.  Keep all follow-up visits as told by your doctor. This is important. Contact a doctor if:  You have new symptoms.  Medicine does not help your pain.  Your symptoms get worse. Get help right away if:  You have very bad numbness or tingling in your wrist or hand. Summary  Carpal tunnel syndrome is a condition that causes pain in your hand and arm.  It is often caused by repeated wrist motions.  Lifestyle changes and medicines are used  to treat this problem. Surgery may help in very bad cases.  Follow your doctor's instructions about wearing a splint, resting your wrist, keeping follow-up visits, and calling for help. This information is not intended to replace advice given to you by your health care provider. Make sure you discuss any questions you have with your health care provider. Document Revised: 06/10/2017 Document Reviewed: 06/10/2017 Elsevier Patient Education  [place].

## 2019-04-26 NOTE — Progress Notes (Signed)
referPatient ID: Darren Burns, male   DOB: 05-06-1975, 44 y.o.   MRN: [id number]   [name], is a 44 y.o. male  [id number]  [id number]  DOB - 07/23/1975  Subjective:  Chief Complaint and HPI: Darren Burns is a 44 y.o. male here today with a few issues.  B hands tingling on first 3 fingers esp at night.  This has been bothering him for about 7-8 months.  NKI.  He does a lot of repetitive work with his hands.    L lateral foot with pain and tenderness for about 1 year.  H/o splinter in that area.  Bothers him off and on.  Also having problems with not being able to see written/typed print as well.  He has to hold things further away to read them.  Denies htn.  Says BP OOO is good.  Denies HA/CP/dizziness   ROS:   Constitutional:  No f/c, No night sweats, No unexplained weight loss. EENT:   No hearing changes. No mouth, throat, or ear problems.  Respiratory: No cough, No SOB Cardiac: No CP, no palpitations GI:  No abd pain, No N/V/D. GU: No Urinary s/sx Musculoskeletal: see above Neuro: No headache, no dizziness, no motor weakness.  Skin: No rash Endocrine:  No polydipsia. No polyuria.  Psych: Denies SI/HI  No problems updated.  ALLERGIES: Allergies  Allergen Reactions  . Ibuprofen Swelling and Other (See Comments)  . Tamiflu [Oseltamivir Phosphate] Nausea And Vomiting    PAST MEDICAL HISTORY: Past Medical History:  Diagnosis Date  . Epididymal mass    left  . Frequency of urination   . GERD (gastroesophageal reflux disease)   . History of exercise intolerance 10/2017   negative ETT, no ischemia  . History of Helicobacter pylori infection 07/2017    MEDICATIONS AT HOME: Prior to Admission medications   Medication Sig Start Date End Date Taking? Authorizing Provider  acetaminophen (TYLENOL) 500 MG tablet Take 1,000 mg by mouth every 6 (six) hours as needed for moderate pain or headache.    [provider]   albuterol (VENTOLIN HFA) 108 (90 Base) MCG/ACT inhaler Inhale 2 puffs into the lungs every 4 (four) hours as needed for wheezing or shortness of breath. Patient not taking: Reported on 04/26/2019 04/24/19   Charlott Rakes, MD  omeprazole (PRILOSEC) 20 MG capsule Take 1 capsule (20 mg total) by mouth 2 (two) times daily before a meal. Patient not taking: Reported on 04/26/2019 01/02/18   Charlott Rakes, MD  polyethylene glycol powder (GLYCOLAX/MIRALAX) powder Take 17 g by mouth daily. Patient not taking: Reported on 04/26/2019 11/26/16   Charlott Rakes, MD  terbinafine (LAMISIL) 250 MG tablet Take 1 tablet (250 mg total) by mouth daily. Patient not taking: Reported on 04/26/2019 01/09/19   Charlott Rakes, MD     Objective:  EXAM:   Vitals:   04/26/19 1613  BP: (!) 151/99  Pulse: 80  Resp: 15  Temp: 98.1 F (36.7 C)  SpO2: 97%  Weight: 197 lb (89.4 kg)  Height: 5\' 5"  (1.651 m)    General appearance : A&OX3. NAD. Non-toxic-appearing HEENT: Atraumatic and Normocephalic.  PERRLA. EOM intact.  TM clear B. Mouth-MMM, post pharynx WNL w/o erythema, No PND. Neck: supple, no JVD. No cervical lymphadenopathy. No thyromegaly Chest/Lungs:  Breathing-non-labored, Good air entry bilaterally, breath sounds normal without rales, rhonchi, or wheezing  CVS: S1 S2 regular, no murmurs, gallops, rubs  L foot and ankle exam normal B hands wrist with full S&ROM.  Normal grip  strength.  +phalen's.  Neg tinel's.   Extremities: Bilateral Lower Ext shows no edema, both legs are warm to touch with = pulse throughout Neurology:  CN II-XII grossly intact, Non focal.   Psych:  TP linear. J/I WNL. Normal speech. Appropriate eye contact and affect.  Skin:  No Rash  Data Review No results found for: HGBA1C   Assessment & Plan   1. Bilateral carpal tunnel syndrome Wrist splints at bedtime - Ambulatory referral to Hand Surgery  2. Left foot pain ? spur - DG Foot Complete Left; Future  3. Screening,  lipid - Lipid panel  4. Screening for diabetes mellitus I have had a lengthy discussion and provided education about insulin resistance and the intake of too much sugar/refined carbohydrates.  I have advised the patient to work at a goal of eliminating sugary drinks, candy, desserts, sweets, refined sugars, processed foods, and white carbohydrates.  The patient expresses understanding.   - Hemoglobin A1c  5. Paresthesias - TSH - Comprehensive metabolic panel  6. Presbyopia - Ambulatory referral to Ophthalmology  7. Elevated blood pressure reading Check BP OOO and record and bring to next visit   Patient have been counseled extensively about nutrition and exercise  Return in about 6 months (around 10/27/2019) for see PCP.  The patient was given clear instructions to go to ER or return to medical center if symptoms don't improve, worsen or new problems develop. The patient verbalized understanding. The patient was told to call to get lab results if they haven't heard anything in the next week.     Freeman Caldron, PA-C [facility] [place], [place]   04/26/2019, 4:51 PM

## 2019-04-26 NOTE — Progress Notes (Signed)
c /o tinglings sensation on both arms and hands particularly at night X 7 months ago . 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

## 2019-04-26 NOTE — Discharge Instructions (Addendum)
Fortunately x-ray of your mid back and your left ankle was normal.  Follow-up closely with your doctor for further care.  You may follow-up with an orthopedist as well.  Wear ankle brace as needed for support.

## 2019-04-26 NOTE — ED Notes (Signed)
ASO brace given to pt, pt explained that he preferred to put brace on at home. Security took pt to his auto in the parking garage, so he didn't have to walk the whole way.  We tried to reassure that the brace would be better put on by someone trained, but pt still insisted that he just takes home to put on there. PA is aware.

## 2019-04-27 LAB — COMPREHENSIVE METABOLIC PANEL
ALT: 29 IU/L (ref 0–44)
AST: 24 IU/L (ref 0–40)
Albumin/Globulin Ratio: 1.8 (ref 1.2–2.2)
Albumin: 4.4 g/dL (ref 4.0–5.0)
Alkaline Phosphatase: 100 IU/L (ref 39–117)
BUN/Creatinine Ratio: 21 — ABNORMAL HIGH (ref 9–20)
BUN: 16 mg/dL (ref 6–24)
Bilirubin Total: 0.2 mg/dL (ref 0.0–1.2)
CO2: 24 mmol/L (ref 20–29)
Calcium: 9.6 mg/dL (ref 8.7–10.2)
Chloride: 103 mmol/L (ref 96–106)
Creatinine, Ser: 0.77 mg/dL (ref 0.76–1.27)
GFR calc Af Amer: 127 mL/min/{1.73_m2} (ref >59)
GFR calc non Af Amer: 110 mL/min/{1.73_m2} (ref >59)
Globulin, Total: 2.5 g/dL (ref 1.5–4.5)
Glucose: 91 mg/dL (ref 65–99)
Potassium: 4.3 mmol/L (ref 3.5–5.2)
Sodium: 140 mmol/L (ref 134–144)
Total Protein: 6.9 g/dL (ref 6.0–8.5)

## 2019-04-27 LAB — LIPID PANEL
Chol/HDL Ratio: 4.4 ratio (ref 0.0–5.0)
Cholesterol, Total: 179 mg/dL (ref 100–199)
HDL: 41 mg/dL (ref >39)
LDL Chol Calc (NIH): 104 mg/dL — ABNORMAL HIGH (ref 0–99)
Triglycerides: 193 mg/dL — ABNORMAL HIGH (ref 0–149)
VLDL Cholesterol Cal: 34 mg/dL (ref 5–40)

## 2019-04-27 LAB — HEMOGLOBIN A1C
Est. average glucose Bld gHb Est-mCnc: 108 mg/dL
Hgb A1c MFr Bld: 5.4 % (ref 4.8–5.6)

## 2019-04-27 LAB — TSH: TSH: 2.23 u[IU]/mL (ref 0.450–4.500)

## 2019-05-15 ENCOUNTER — Ambulatory Visit (INDEPENDENT_AMBULATORY_CARE_PROVIDER_SITE_OTHER): Payer: Self-pay | Admitting: Orthopaedic Surgery

## 2019-05-15 ENCOUNTER — Encounter: Payer: Self-pay | Admitting: Physician Assistant

## 2019-05-15 ENCOUNTER — Other Ambulatory Visit: Payer: Self-pay

## 2019-05-15 DIAGNOSIS — G5603 Carpal tunnel syndrome, bilateral upper limbs: Secondary | ICD-10-CM

## 2019-05-15 NOTE — Progress Notes (Signed)
Office Visit Note   Patient: Darren Burns           Date of Birth: 1975-09-27           MRN: [id number] Visit Date: 05/15/2019              Requested by: Argentina Donovan, PA-C [place],  [place] [address] PCP: Charlott Rakes, MD   Assessment & Plan: Visit Diagnoses:  1. Bilateral carpal tunnel syndrome     Plan: Impression is bilateral carpal tunnel syndrome.  We will refer the patient to Dr. Ernestina Patches for bilateral upper extremity nerve conduction study/EMG.  We will follow up with [place] once has been completed.  Follow-Up Instructions: Return for after NCS/EMG.   Orders:  No orders of the defined types were placed in this encounter.  No orders of the defined types were placed in this encounter.     Procedures: No procedures performed   Clinical Data: No additional findings.   Subjective: Chief Complaint  Patient presents with  . Right Hand - Pain, Numbness, Tingling  . Left Hand - Pain, Numbness, Tingling    HPI patient is a pleasant 44 year old right-hand-dominant gentleman who comes in today with numbness, tingling and burning to both hands for the past year.  Both equally as bad.  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  That seemed to aggravate his symptoms.  Pain he has primarily occurs at night.  He has been wearing bilateral wrist splints that do seem to help while sleeping.  All of the symptoms are to the median nerve distribution.  No weakness.  No previous nerve conduction study for carpal tunnel injections.  No previous cervical spine pathology.  Review of Systems as detailed in HPI.  All others reviewed and are negative.   Objective: Vital Signs: There were no vitals taken for this visit.  Physical Exam well-developed well-nourished gentleman in no acute distress.  Alert and oriented x3.  Ortho Exam examination of both wrists  reveal positive Phalen and positive Tinel.  Negative Tinel at the elbows.  No thenar atrophy.  He is neurovascularly intact distally.  Specialty Comments:  No specialty comments available.  Imaging: No new imaging   PMFS History: Patient Active Problem List   Diagnosis Date Noted  . GERD (gastroesophageal reflux disease) 08/17/2016  . Chest pain 11/10/2014  . Pain in the chest   . Dental cavities 10/25/2013  . Annual physical exam 10/25/2013   Past Medical History:  Diagnosis Date  . Epididymal mass    left  . Frequency of urination   . GERD (gastroesophageal reflux disease)   . History of exercise intolerance 10/2017   negative ETT, no ischemia  . History of Helicobacter pylori infection 07/2017    History reviewed. No pertinent family history.  Past Surgical History:  Procedure Laterality Date  . CYSTOSCOPY N/A 11/24/2017   Procedure: FLEX CYSTOSCOPY;  Surgeon: Irine Seal, MD;  Location: [facility];  Service: Urology;  Laterality: N/A;  . NO PAST SURGERIES    . SCROTAL EXPLORATION Left 11/24/2017   Procedure: LEFT INGUINAL EXPLORATION WITH EXCISION OF EPIDYIMAL MASS;  Surgeon: Irine Seal, MD;  Location: [facility];  Service: Urology;  Laterality: Left;   Social History   Occupational History  . Not on file  Tobacco Use  . Smoking status: Former Smoker    Years:  10.00    Types: Cigarettes    Quit date: 02/15/1998    Years since quitting: 21.2  . Smokeless tobacco: Never Used  Substance and Sexual Activity  . Alcohol use: No  . Drug use: Not Currently    Types: Cocaine, "Crack" cocaine, Marijuana    Comment: 11-22-2017 per pt last cocaine 2001  . Sexual activity: Yes

## 2019-06-06 ENCOUNTER — Ambulatory Visit (INDEPENDENT_AMBULATORY_CARE_PROVIDER_SITE_OTHER): Payer: Self-pay | Admitting: Physical Medicine and Rehabilitation

## 2019-06-06 ENCOUNTER — Other Ambulatory Visit: Payer: Self-pay

## 2019-06-06 ENCOUNTER — Encounter: Payer: Self-pay | Admitting: Physical Medicine and Rehabilitation

## 2019-06-06 DIAGNOSIS — R202 Paresthesia of skin: Secondary | ICD-10-CM

## 2019-06-06 NOTE — Progress Notes (Signed)
Darren Burns - 44 y.o. male MRN [id number]  Date of birth: 04-29-1975  Office Visit Note: Visit Date: 06/06/2019 PCP: Darren Rakes, MD Referred by: Darren Rakes, MD  Subjective: Chief Complaint  Patient presents with  . Right Hand - Numbness  . Left Hand - Numbness   HPI:  Darren Burns is a 44 y.o. male who comes in today For electrodiagnostic study of both upper extremities at the request of Dr. Eduard Roux.  Patient is right-hand dominant with pain numbness and tingling in both hands particularly in the radial 3 digits.  He reports some weakness with difficulty straightening his fingers.  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  He has been using a brace at night has helped with his sleeping.  Rates his pain as a 9 out of 10.  No frank radicular symptoms.  No prior electrodiagnostic study.  ROS Otherwise per HPI.  Assessment & Plan: Visit Diagnoses:  1. Paresthesia of skin     Plan: Impression: The above electrodiagnostic study is ABNORMAL and reveals evidence of a severe BILATREAL median nerve entrapment at the wrist (carpal tunnel syndrome) affecting sensory and motor components.   There is no significant electrodiagnostic evidence of any other focal nerve entrapment, brachial plexopathy or generalized peripheral neuropathy.   Recommendations: 1.  Follow-up with referring physician. 2.  Continue current management of symptoms. 3.  Suggest surgical evaluation.   Meds & Orders: No orders of the defined types were placed in this encounter.   Orders Placed This Encounter  Procedures  . NCV with EMG (electromyography)    Follow-up: Return in about 2 weeks (around 06/20/2019) for Eduard Roux, MD.   Procedures: No procedures performed  EMG & NCV Findings: Evaluation of the left median motor nerve showed prolonged distal onset latency (8.2 ms) and reduced amplitude (3.0 mV).  The right  median motor nerve showed prolonged distal onset latency (6.6 ms), reduced amplitude (3.1 mV), and decreased conduction velocity (Elbow-Wrist, 45 m/s).  The left median (across palm) sensory nerve showed no response (Wrist) and prolonged distal peak latency (Palm, 4.2 ms).  The right median (across palm) sensory nerve showed no response (Palm) and prolonged distal peak latency (7.6 ms).  All remaining nerves (as indicated in the following tables) were within normal limits.  Left vs. Right side comparison data for the median motor nerve indicates abnormal L-R latency difference (1.6 ms) and abnormal L-R velocity difference (Elbow-Wrist, 60 m/s).    All examined muscles (as indicated in the following table) showed no evidence of electrical instability.    Impression: The above electrodiagnostic study is ABNORMAL and reveals evidence of a severe BILATREAL median nerve entrapment at the wrist (carpal tunnel syndrome) affecting sensory and motor components.   There is no significant electrodiagnostic evidence of any other focal nerve entrapment, brachial plexopathy or generalized peripheral neuropathy.   Recommendations: 1.  Follow-up with referring physician. 2.  Continue current management of symptoms. 3.  Suggest surgical evaluation.  ___________________________ Laurence Spates FAAPMR Board Certified, American Board of Physical Medicine and Rehabilitation    Nerve Conduction Studies Anti Sensory Summary Table   Stim Site NR Peak (ms) Norm Peak (ms) P-T Amp (V) Norm P-T Amp Site1 Site2 Delta-P (ms) Dist (cm) Vel (m/s) Norm Vel (m/s)  Left Median Acr Palm Anti Sensory (2nd Digit)  30.5C  Wrist *NR  <3.6  >10 Wrist Palm  0.0    Palm    *  4.2 <2.0 1.3         Right Median Acr Palm Anti Sensory (2nd Digit)  30C  Wrist    *7.6 <3.6 12.3 >10 Wrist Palm  0.0    Palm *NR  <2.0          Right Radial Anti Sensory (Base 1st Digit)  30.1C  Wrist    2.2 <3.1 26.8  Wrist Base 1st Digit 2.2 0.0    Right  Ulnar Anti Sensory (5th Digit)  30.3C  Wrist    3.4 <3.7 20.0 >15.0 Wrist 5th Digit 3.4 14.0 41 >38   Motor Summary Table   Stim Site NR Onset (ms) Norm Onset (ms) O-P Amp (mV) Norm O-P Amp Site1 Site2 Delta-0 (ms) Dist (cm) Vel (m/s) Norm Vel (m/s)  Left Median Motor (Abd Poll Brev)  31.3C    Martin-Gruber Anast.  Wrist    *8.2 <4.2 *3.0 >5 Elbow Wrist 2.0 21.0 105 >50  Elbow    10.2  2.8         Right Median Motor (Abd Poll Brev)  30.3C  Wrist    *6.6 <4.2 *3.1 >5 Elbow Wrist 4.4 20.0 *45 >50  Elbow    11.0  3.3         Right Ulnar Motor (Abd Dig Min)  30.5C  Wrist    3.4 <4.2 10.8 >3 B Elbow Wrist 3.3 20.0 61 >53  B Elbow    6.7  10.7  A Elbow B Elbow 1.3 10.0 77 >53  A Elbow    8.0  10.8          EMG   Side Muscle Nerve Root Ins Act Fibs Psw Amp Dur Poly Recrt Int Fraser Din Comment  Right Abd Poll Brev Median C8-T1 Nml Nml Nml Nml Nml 0 Nml Nml   Right 1stDorInt Ulnar C8-T1 Nml Nml Nml Nml Nml 0 Nml Nml     Nerve Conduction Studies Anti Sensory Left/Right Comparison   Stim Site L Lat (ms) R Lat (ms) L-R Lat (ms) L Amp (V) R Amp (V) L-R Amp (%) Site1 Site2 L Vel (m/s) R Vel (m/s) L-R Vel (m/s)  Median Acr Palm Anti Sensory (2nd Digit)  30.5C  Wrist  *7.6   12.3  Wrist Palm     Palm *4.2   1.3         Radial Anti Sensory (Base 1st Digit)  30.1C  Wrist  2.2   26.8  Wrist Base 1st Digit     Ulnar Anti Sensory (5th Digit)  30.3C  Wrist  3.4   20.0  Wrist 5th Digit  41    Motor Left/Right Comparison   Stim Site L Lat (ms) R Lat (ms) L-R Lat (ms) L Amp (mV) R Amp (mV) L-R Amp (%) Site1 Site2 L Vel (m/s) R Vel (m/s) L-R Vel (m/s)  Median Motor (Abd Poll Brev)  31.3C    Martin-Gruber Anast.  Wrist *8.2 *6.6 *1.6 *3.0 *3.1 3.2 Elbow Wrist 105 *45 *60  Elbow 10.2 11.0 0.8 2.8 3.3 15.2       Ulnar Motor (Abd Dig Min)  30.5C  Wrist  3.4   10.8  B Elbow Wrist  61   B Elbow  6.7   10.7  A Elbow B Elbow  77   A Elbow  8.0   10.8           Waveforms:  Clinical History: No specialty comments available.     Objective:  VS:  HT:    WT:   BMI:     BP:   HR: bpm  TEMP: ( )  RESP:  Physical Exam Musculoskeletal:        General: No tenderness.     Comments: Inspection reveals no atrophy of the bilateral APB or FDI or hand intrinsics. There is no swelling, color changes, allodynia or dystrophic changes. There is 5 out of 5 strength in the bilateral wrist extension, finger abduction and long finger flexion.  He has some decreased sensation to light touch in median nerve distribution bilaterally.  There is a positive Phalen's test bilaterally. There is a negative Hoffmann's test bilaterally.  Skin:    General: Skin is warm and dry.     Findings: No erythema or rash.  Neurological:     General: No focal deficit present.     Mental Status: He is alert and oriented to person, place, and time.     Sensory: No sensory deficit.     Motor: No weakness or abnormal muscle tone.     Coordination: Coordination normal.     Gait: Gait normal.  Psychiatric:        Mood and Affect: Mood normal.        Behavior: Behavior normal.        Thought Content: Thought content normal.     Ortho Exam Imaging: No results found.

## 2019-06-06 NOTE — Progress Notes (Signed)
  Numeric Pain Rating Scale and Functional Assessment Average Pain 9   In the last MONTH (on 0-10 scale) has pain interfered with the following?  1. General activity like being  able to carry out your everyday physical activities such as walking, climbing stairs, carrying groceries, or moving a chair?  Rating(9)     

## 2019-06-07 NOTE — Procedures (Signed)
EMG & NCV Findings: Evaluation of the left median motor nerve showed prolonged distal onset latency (8.2 ms) and reduced amplitude (3.0 mV).  The right median motor nerve showed prolonged distal onset latency (6.6 ms), reduced amplitude (3.1 mV), and decreased conduction velocity (Elbow-Wrist, 45 m/s).  The left median (across palm) sensory nerve showed no response (Wrist) and prolonged distal peak latency (Palm, 4.2 ms).  The right median (across palm) sensory nerve showed no response (Palm) and prolonged distal peak latency (7.6 ms).  All remaining nerves (as indicated in the following tables) were within normal limits.  Left vs. Right side comparison data for the median motor nerve indicates abnormal L-R latency difference (1.6 ms) and abnormal L-R velocity difference (Elbow-Wrist, 60 m/s).    All examined muscles (as indicated in the following table) showed no evidence of electrical instability.    Impression: The above electrodiagnostic study is ABNORMAL and reveals evidence of a severe BILATREAL median nerve entrapment at the wrist (carpal tunnel syndrome) affecting sensory and motor components.   There is no significant electrodiagnostic evidence of any other focal nerve entrapment, brachial plexopathy or generalized peripheral neuropathy.   Recommendations: 1.  Follow-up with referring physician. 2.  Continue current management of symptoms. 3.  Suggest surgical evaluation.  ___________________________ Laurence Spates FAAPMR Board Certified, American Board of Physical Medicine and Rehabilitation    Nerve Conduction Studies Anti Sensory Summary Table   Stim Site NR Peak (ms) Norm Peak (ms) P-T Amp (V) Norm P-T Amp Site1 Site2 Delta-P (ms) Dist (cm) Vel (m/s) Norm Vel (m/s)  Left Median Acr Palm Anti Sensory (2nd Digit)  30.5C  Wrist *NR  <3.6  >10 Wrist Palm  0.0    Palm    *4.2 <2.0 1.3         Right Median Acr Palm Anti Sensory (2nd Digit)  30C  Wrist    *7.6 <3.6 12.3 >10 Wrist  Palm  0.0    Palm *NR  <2.0          Right Radial Anti Sensory (Base 1st Digit)  30.1C  Wrist    2.2 <3.1 26.8  Wrist Base 1st Digit 2.2 0.0    Right Ulnar Anti Sensory (5th Digit)  30.3C  Wrist    3.4 <3.7 20.0 >15.0 Wrist 5th Digit 3.4 14.0 41 >38   Motor Summary Table   Stim Site NR Onset (ms) Norm Onset (ms) O-P Amp (mV) Norm O-P Amp Site1 Site2 Delta-0 (ms) Dist (cm) Vel (m/s) Norm Vel (m/s)  Left Median Motor (Abd Poll Brev)  31.3C    Martin-Gruber Anast.  Wrist    *8.2 <4.2 *3.0 >5 Elbow Wrist 2.0 21.0 105 >50  Elbow    10.2  2.8         Right Median Motor (Abd Poll Brev)  30.3C  Wrist    *6.6 <4.2 *3.1 >5 Elbow Wrist 4.4 20.0 *45 >50  Elbow    11.0  3.3         Right Ulnar Motor (Abd Dig Min)  30.5C  Wrist    3.4 <4.2 10.8 >3 B Elbow Wrist 3.3 20.0 61 >53  B Elbow    6.7  10.7  A Elbow B Elbow 1.3 10.0 77 >53  A Elbow    8.0  10.8          EMG   Side Muscle Nerve Root Ins Act Fibs Psw Amp Dur Poly Recrt Int Fraser Din Comment  Right Abd Poll Brev Median C8-T1  Nml Nml Nml Nml Nml 0 Nml Nml   Right 1stDorInt Ulnar C8-T1 Nml Nml Nml Nml Nml 0 Nml Nml     Nerve Conduction Studies Anti Sensory Left/Right Comparison   Stim Site L Lat (ms) R Lat (ms) L-R Lat (ms) L Amp (V) R Amp (V) L-R Amp (%) Site1 Site2 L Vel (m/s) R Vel (m/s) L-R Vel (m/s)  Median Acr Palm Anti Sensory (2nd Digit)  30.5C  Wrist  *7.6   12.3  Wrist Palm     Palm *4.2   1.3         Radial Anti Sensory (Base 1st Digit)  30.1C  Wrist  2.2   26.8  Wrist Base 1st Digit     Ulnar Anti Sensory (5th Digit)  30.3C  Wrist  3.4   20.0  Wrist 5th Digit  41    Motor Left/Right Comparison   Stim Site L Lat (ms) R Lat (ms) L-R Lat (ms) L Amp (mV) R Amp (mV) L-R Amp (%) Site1 Site2 L Vel (m/s) R Vel (m/s) L-R Vel (m/s)  Median Motor (Abd Poll Brev)  31.3C    Martin-Gruber Anast.  Wrist *8.2 *6.6 *1.6 *3.0 *3.1 3.2 Elbow Wrist 105 *45 *60  Elbow 10.2 11.0 0.8 2.8 3.3 15.2       Ulnar Motor (Abd Dig Min)  30.5C    Wrist  3.4   10.8  B Elbow Wrist  61   B Elbow  6.7   10.7  A Elbow B Elbow  77   A Elbow  8.0   10.8           Waveforms:

## 2019-06-12 ENCOUNTER — Other Ambulatory Visit: Payer: Self-pay

## 2019-06-12 ENCOUNTER — Ambulatory Visit: Payer: Self-pay | Admitting: Orthopaedic Surgery

## 2019-08-14 ENCOUNTER — Ambulatory Visit (INDEPENDENT_AMBULATORY_CARE_PROVIDER_SITE_OTHER): Payer: Self-pay | Admitting: Orthopaedic Surgery

## 2019-08-14 ENCOUNTER — Encounter: Payer: Self-pay | Admitting: Orthopaedic Surgery

## 2019-08-14 DIAGNOSIS — G5602 Carpal tunnel syndrome, left upper limb: Secondary | ICD-10-CM | POA: Insufficient documentation

## 2019-08-14 DIAGNOSIS — G5601 Carpal tunnel syndrome, right upper limb: Secondary | ICD-10-CM | POA: Insufficient documentation

## 2019-08-14 NOTE — Progress Notes (Signed)
   Office Visit Note   Patient: Darren Burns           Date of Birth: [birth year]           MRN: [id number] Visit Date: 08/14/2019              Requested by: Charlott Rakes, MD [place],  [place] [id number] PCP: Charlott Rakes, MD   Assessment & Plan: Visit Diagnoses:  1. Right carpal tunnel syndrome   2. Left carpal tunnel syndrome     Plan: Impression is severe bilateral carpal tunnel syndrome without any axonal injury.  Results were reviewed with the patient in detail and recommendation has been made for surgical release in a staged fashion.  Based on our discussion patient has elected to proceed with carpal tunnel release with the right 1 first.  Risk benefits rehab recovery were reviewed in detail.  Follow-Up Instructions: Return for 1 week postop visit.   Orders:  No orders of the defined types were placed in this encounter.  No orders of the defined types were placed in this encounter.     Procedures: No procedures performed   Clinical Data: No additional findings.   Subjective: Chief Complaint  Patient presents with  . Left Hand - Follow-up  . Right Hand - Follow-up    Patient is a 44 year old gentleman here to review nerve conduction studies for carpal tunnel syndrome.  These showed severe bilateral carpal tunnel syndrome.   Review of Systems   Objective: Vital Signs: There were no vitals taken for this visit.  Physical Exam  Ortho Exam Exam is unchanged. Specialty Comments:  No specialty comments available.  Imaging: No results found.   PMFS History: Patient Active Problem List   Diagnosis Date Noted  . Right carpal tunnel syndrome 08/14/2019  . Left carpal tunnel syndrome 08/14/2019  . GERD (gastroesophageal reflux disease) 08/17/2016  . Chest pain 11/10/2014  . Pain in the chest   . Dental cavities 10/25/2013  . Annual physical exam 10/25/2013   Past Medical History:  Diagnosis Date  . Epididymal  mass    left  . Frequency of urination   . GERD (gastroesophageal reflux disease)   . History of exercise intolerance 10/2017   negative ETT, no ischemia  . History of Helicobacter pylori infection 07/2017    History reviewed. No pertinent family history.  Past Surgical History:  Procedure Laterality Date  . CYSTOSCOPY N/A 11/24/2017   Procedure: FLEX CYSTOSCOPY;  Surgeon: Irine Seal, MD;  Location: [facility];  Service: Urology;  Laterality: N/A;  . NO PAST SURGERIES    . SCROTAL EXPLORATION Left 11/24/2017   Procedure: LEFT INGUINAL EXPLORATION WITH EXCISION OF EPIDYIMAL MASS;  Surgeon: Irine Seal, MD;  Location: [facility];  Service: Urology;  Laterality: Left;   Social History   Occupational History  . Not on file  Tobacco Use  . Smoking status: Former Smoker    Years: 10.00    Types: Cigarettes    Quit date: 02/15/1998    Years since quitting: 21.5  . Smokeless tobacco: Never Used  Vaping Use  . Vaping Use: Never used  Substance and Sexual Activity  . Alcohol use: No  . Drug use: Not Currently    Types: Cocaine, "Crack" cocaine, Marijuana    Comment: 11-22-2017 per pt last cocaine 2001  . Sexual activity: Yes

## 2019-08-15 ENCOUNTER — Ambulatory Visit: Payer: Self-pay | Attending: Physician Assistant | Admitting: Physician Assistant

## 2019-08-15 ENCOUNTER — Encounter: Payer: Self-pay | Admitting: Physician Assistant

## 2019-08-15 DIAGNOSIS — K219 Gastro-esophageal reflux disease without esophagitis: Secondary | ICD-10-CM

## 2019-08-15 DIAGNOSIS — M7660 Achilles tendinitis, unspecified leg: Secondary | ICD-10-CM

## 2019-08-15 DIAGNOSIS — M79672 Pain in left foot: Secondary | ICD-10-CM

## 2019-08-15 DIAGNOSIS — Z800 Family history of malignant neoplasm of digestive organs: Secondary | ICD-10-CM

## 2019-08-15 DIAGNOSIS — B351 Tinea unguium: Secondary | ICD-10-CM

## 2019-08-15 MED ORDER — OMEPRAZOLE 20 MG PO CPDR: 20.0000 mg | DELAYED_RELEASE_CAPSULE | Freq: Every day | ORAL | 3 refills | Status: DC

## 2019-08-15 MED ORDER — PREDNISONE 10 MG PO TABS: ORAL_TABLET | ORAL | 0 refills | Status: DC

## 2019-08-15 MED ORDER — TERBINAFINE HCL 250 MG PO TABS: 250.0000 mg | ORAL_TABLET | Freq: Every day | ORAL | 2 refills | Status: DC

## 2019-08-15 MED FILL — OMEPRAZOLE 20 MG CAP: 20 | 30 days supply | Qty: 30 | Fill #0

## 2019-08-15 MED FILL — TERBINAFINE HCL 250 MG TAB: 250 | 30 days supply | Qty: 30 | Fill #0

## 2019-08-15 MED FILL — predniSONE 10 MG TABS: 10 | 7 days supply | Qty: 21 | Fill #0

## 2019-08-15 NOTE — Progress Notes (Signed)
DOB and name verified  C/o tendon pain on his L foot x 6 months Need omeprazole and Lamisil refills

## 2019-08-15 NOTE — Progress Notes (Signed)
Virtual Visit via Telephone Note  I connected with [name] on 08/15/19 at  2:50 PM EDT by telephone and verified that I am speaking with the correct person using two identifiers.   I discussed the limitations, risks, security and privacy concerns of performing an evaluation and management service by telephone and the availability of in person appointments. I also discussed with the patient that there may be a patient responsible charge related to this service. The patient expressed understanding and agreed to proceed.  PATIENT visit by telephone virtually in the context of Covid-19 pandemic. Patient location:  home My Location:  [place] office Persons on the call: me and the patient   History of Present Illness:  C/o L foot pain/achilles for several months.  Twisted ankle a while ago.  Says he had it xrayed and the xrays were normal.    Needs RF of omeprazole.  Also needs RF of lamisil bc he never took it and still has nail fungus    Observations/Objective: NAD.  A&Ox3  Assessment and Plan: 1. Achilles tendon pain - Ambulatory referral to Orthopedic Surgery  2. Left foot pain - Ambulatory referral to Orthopedic Surgery  3. Gastroesophageal reflux disease without esophagitis - omeprazole (PRILOSEC) 20 MG capsule; Take 1 capsule (20 mg total) by mouth daily.  Dispense: 30 capsule; Refill: 3 - Ambulatory referral to Gastroenterology  4. Onychomycosis - terbinafine (LAMISIL) 250 MG tablet; Take 1 tablet (250 mg total) by mouth daily.  Dispense: 30 tablet; Refill: 2  5. Family history of colon cancer - Ambulatory referral to Gastroenterology   Follow Up Instructions: See PCP prn   I discussed the assessment and treatment plan with the patient. The patient was provided an opportunity to ask questions and all were answered. The patient agreed with the plan and demonstrated an understanding of the instructions.   The patient was advised to call back or seek an  in-person evaluation if the symptoms worsen or if the condition fails to improve as anticipated.  I provided 12 minutes of non-face-to-face time during this encounter.   Freeman Caldron, PA-C  Patient ID: Darren Burns, male   DOB: 1975/05/21, 45 y.o.   MRN: [id number]

## 2019-08-22 ENCOUNTER — Ambulatory Visit: Payer: Self-pay | Admitting: Orthopaedic Surgery

## 2019-09-13 MED FILL — TERBINAFINE HCL 250 MG TABS: 250 | 30 days supply | Qty: 30 | Fill #1

## 2019-09-21 MED FILL — TERBINAFINE HCL 250 MG TABS: 250 | 30 days supply | Qty: 30 | Fill #1

## 2019-10-03 ENCOUNTER — Encounter (HOSPITAL_BASED_OUTPATIENT_CLINIC_OR_DEPARTMENT_OTHER): Payer: Self-pay | Admitting: Orthopaedic Surgery

## 2019-10-03 ENCOUNTER — Other Ambulatory Visit: Payer: Self-pay

## 2019-10-06 ENCOUNTER — Inpatient Hospital Stay (HOSPITAL_COMMUNITY): Admission: RE | Admit: 2019-10-06 | Payer: Self-pay | Source: Ambulatory Visit

## 2019-10-09 ENCOUNTER — Ambulatory Visit: Payer: Self-pay

## 2019-10-09 NOTE — Telephone Encounter (Signed)
Patient called stating that he has been dizzy for 3 days. He states it is especially worse when he looks down.  He states that his other symptom is left side headache pain around his ear. He says he has had ringing in his ears as a chronic condition. BP today on the phone with me 146/98.  He denies weakness on one side of his body. Per protocol patient will go to UC today for evaluation of symptoms. No appointments available in office. Care advice read to patient. He verbalized understanding of all information and agrees with plan of care.  Reason for Disposition . [1] MODERATE dizziness (e.g., vertigo; feels very unsteady, interferes with normal activities) AND [2] has NOT been evaluated by physician for this  Answer Assessment - Initial Assessment Questions 1. DESCRIPTION: "Describe your dizziness."     Looking down left side 2. VERTIGO: "Do you feel like either you or the room is spinning or tilting?"     Especially when looking done  3. LIGHTHEADED: "Do you feel lightheaded?" (e.g., somewhat faint, woozy, weak upon standing)     no 4. SEVERITY: "How bad is it?"  "Can you walk?"   - MILD: Feels unsteady but walking normally.   - MODERATE: Feels very unsteady when walking, but not falling; interferes with normal activities (e.g., school, work) .   - SEVERE: Unable to walk without falling, or requires assistance to walk without falling.    moderate 5. ONSET:  "When did the dizziness begin?"    3 days 6. AGGRAVATING FACTORS: "Does anything make it worse?" (e.g., standing, change in head position)     Changing head position 7. CAUSE: "What do you think is causing the dizziness?"    unsure 8. RECURRENT SYMPTOM: "Have you had dizziness before?" If Yes, ask: "When was the last time?" "What happened that time?"    Nothing has ringing in ears 9. OTHER SYMPTOMS: "Do you have any other symptoms?" (e.g., headache, weakness, numbness, vomiting, earache)    Left shoulder up to ear headache 10.  PREGNANCY: "Is there any chance you are pregnant?" "When was your last menstrual period?"      N/A  Protocols used: DIZZINESS - VERTIGO-A-AH

## 2019-10-09 NOTE — Progress Notes (Signed)
Called pt about covid test, pt states is not feeling well today, states blood pressure is up and is dizzy. Instructed pt to go to the clinic today. Pt states is not having surgery tomorrow. Notified Sherrie at Dr. Phoebe Sharps office.

## 2019-10-10 ENCOUNTER — Ambulatory Visit (HOSPITAL_BASED_OUTPATIENT_CLINIC_OR_DEPARTMENT_OTHER): Admission: RE | Admit: 2019-10-10 | Payer: Self-pay | Source: Home / Self Care | Admitting: Orthopaedic Surgery

## 2019-10-10 SURGERY — CARPAL TUNNEL RELEASE
Anesthesia: Regional | Laterality: Right

## 2019-10-11 ENCOUNTER — Telehealth: Payer: Self-pay | Admitting: Family Medicine

## 2019-10-11 NOTE — Telephone Encounter (Signed)
I called back the Pt and inform him that he need to call on 10/22/19 to schedule a financial appt not before

## 2019-10-11 NOTE — Telephone Encounter (Signed)
Please follow up Copied from [place] #[id number]. Topic: General - Inquiry >> Oct 10, 2019  3:45 PM Alease Frame wrote: Reason for CRM: Pt is calling in to sch an appt regarding an orange card . Please advise

## 2019-10-17 ENCOUNTER — Inpatient Hospital Stay: Payer: Self-pay | Admitting: Orthopaedic Surgery

## 2019-10-26 MED FILL — TERBINAFINE HCL 250 MG TABS: 250 | 30 days supply | Qty: 30 | Fill #2

## 2019-11-05 ENCOUNTER — Ambulatory Visit: Payer: Self-pay

## 2020-03-10 ENCOUNTER — Other Ambulatory Visit: Payer: Self-pay

## 2020-04-25 ENCOUNTER — Other Ambulatory Visit: Payer: Self-pay | Admitting: Family Medicine

## 2020-04-25 DIAGNOSIS — K5909 Other constipation: Secondary | ICD-10-CM

## 2020-04-25 NOTE — Telephone Encounter (Signed)
Medication Refill - Medication: polyethylene glycol powder (GLYCOLAX/MIRALAX) powder     Preferred Pharmacy (with phone number or street name):  [place] [address] [facility] Phone:  [phone]  Fax:  [phone]       Agent: Please be advised that RX refills may take up to 3 business days. We ask that you follow-up with your pharmacy.

## 2020-04-25 NOTE — Telephone Encounter (Signed)
Requested medication (s) are due for refill today: Yes  Requested medication (s) are on the active medication list: Yes  Last refill:  11/26/16  Future visit scheduled: Yes  Notes to clinic:  Unable to refill per protocol, Rx expired.      Requested Prescriptions  Pending Prescriptions Disp Refills   polyethylene glycol powder (GLYCOLAX/MIRALAX) 17 GM/SCOOP powder 3350 g 1    Sig: Take 17 g by mouth daily.      Gastroenterology:  Laxatives Passed - 04/25/2020  1:51 PM      Passed - Valid encounter within last 12 months    Recent Outpatient Visits           8 months ago Achilles tendon pain   [facility] [place], [place], [place]   1 year ago Bilateral carpal tunnel syndrome   [facility] [place], [place]ne [place], [place]   1 year ago Thoracolumbar back pain   [place], [place]   2 years ago Onychomycosis   [place], [place], [place]   2 years ago H. pylori infection   [facility] [place], [place], [place]       Future Appointments             In 3 weeks Thereasa Solo, [place]cy, PA-C [place]

## 2020-05-05 ENCOUNTER — Ambulatory Visit: Payer: Self-pay

## 2020-05-22 ENCOUNTER — Ambulatory Visit: Payer: Self-pay | Admitting: Physician Assistant

## 2020-08-26 ENCOUNTER — Ambulatory Visit: Payer: Self-pay

## 2020-11-14 ENCOUNTER — Ambulatory Visit: Payer: Self-pay

## 2020-12-08 ENCOUNTER — Ambulatory Visit: Payer: Self-pay

## 2020-12-08 NOTE — Telephone Encounter (Signed)
Pt. Reports he yesterday had palpitations that lasted a few seconds. On his Apple watch heart rate was 97-115. Had a sharp pain in left chest and left arm that lasted a few seconds. No chest pain today. Has recently had increased stress as well. States he needs help "with my orange card too." Requesting to be seen today if possible. Instructed to go to ED for worsening of symptoms. Verbalizes understanding. Please advise.        Reason for Disposition  Heart rhythm alert (e.g., "you have irregular heartbeat") from personal wearable device (e.g., Apple Watch)  Answer Assessment - Initial Assessment Questions 1. DESCRIPTION: "Please describe your heart rate or heartbeat that you are having" (e.g., fast/slow, regular/irregular, skipped or extra beats, "palpitations")     Palpitations 2. ONSET: "When did it start?" (Minutes, hours or days)      Yesterday 3. DURATION: "How long does it last" (e.g., seconds, minutes, hours)     5 seconds 4. PATTERN "Does it come and go, or has it been constant since it started?"  "Does it get worse with exertion?"   "Are you feeling it now?"     Comes and goes 5. TAP: "Using your hand, can you tap out what you are feeling on a chair or table in front of you, so that I can hear?" (Note: not all patients can do this)       No 6. HEART RATE: "Can you tell me your heart rate?" "How many beats in 15 seconds?"  (Note: not all patients can do this)       97-115 7. RECURRENT SYMPTOM: "Have you ever had this before?" If Yes, ask: "When was the last time?" and "What happened that time?"      No 8. CAUSE: "What do you think is causing the palpitations?"     Stress 9. CARDIAC HISTORY: "Do you have any history of heart disease?" (e.g., heart attack, angina, bypass surgery, angioplasty, arrhythmia)      No 10. OTHER SYMPTOMS: "Do you have any other symptoms?" (e.g., dizziness, chest pain, sweating, difficulty breathing)       Left arm- sharp pain 11. PREGNANCY: "Is there  any chance you are pregnant?" "When was your last menstrual period?"       N/a  Protocols used: Heart Rate and Heartbeat Questions-A-AH

## 2020-12-08 NOTE — Telephone Encounter (Signed)
Scheduled appt. For patient. Advised if he has worsening symptoms, to go to ED. Patient verbalized understanding.

## 2020-12-09 ENCOUNTER — Other Ambulatory Visit: Payer: Self-pay

## 2020-12-09 ENCOUNTER — Encounter: Payer: Self-pay | Admitting: Family Medicine

## 2020-12-09 ENCOUNTER — Ambulatory Visit: Payer: Self-pay | Attending: Family Medicine | Admitting: Family Medicine

## 2020-12-09 VITALS — BP 147/96 | HR 78 | Ht 65.0 in | Wt 204.0 lb

## 2020-12-09 DIAGNOSIS — F419 Anxiety disorder, unspecified: Secondary | ICD-10-CM

## 2020-12-09 DIAGNOSIS — Z566 Other physical and mental strain related to work: Secondary | ICD-10-CM | POA: Insufficient documentation

## 2020-12-09 DIAGNOSIS — H9313 Tinnitus, bilateral: Secondary | ICD-10-CM

## 2020-12-09 DIAGNOSIS — R002 Palpitations: Secondary | ICD-10-CM

## 2020-12-09 DIAGNOSIS — G4700 Insomnia, unspecified: Secondary | ICD-10-CM | POA: Insufficient documentation

## 2020-12-09 DIAGNOSIS — R030 Elevated blood-pressure reading, without diagnosis of hypertension: Secondary | ICD-10-CM

## 2020-12-09 DIAGNOSIS — R0789 Other chest pain: Secondary | ICD-10-CM

## 2020-12-09 DIAGNOSIS — Z79899 Other long term (current) drug therapy: Secondary | ICD-10-CM | POA: Insufficient documentation

## 2020-12-09 MED ORDER — HYDROXYZINE HCL 25 MG PO TABS
25.0000 mg | ORAL_TABLET | Freq: Every evening | ORAL | 2 refills | Status: DC | PRN
  Filled 2020-12-09: qty 30, 30d supply, fill #0

## 2020-12-09 NOTE — Progress Notes (Signed)
Had chest pain yesterday.  Work has been stressful.

## 2020-12-09 NOTE — Patient Instructions (Signed)
Tinnitus Tinnitus refers to hearing a sound when there is no actual source for that sound. This is often described as ringing in the ears. However, people with this condition may hear a variety of noises, in one ear or in both ears. The sounds of tinnitus can be soft, loud, or somewhere in between. Tinnitus can last for a few seconds or can be constant for days. It may go away without treatment and come back at various times. When tinnitus is constant or happens often, it can lead to other problems, such as trouble sleeping and trouble concentrating. Almost everyone experiences tinnitus at some point. Tinnitus is not the same as hearing loss. Tinnitus that is long-lasting (chronic) or comes back often (recurs) may require medical attention. What are the causes? The cause of tinnitus is often not known. In some cases, it can result from: Exposure to loud noises from machinery, music, or other sources. An object (foreign body) stuck in the ear. Earwax buildup. Drinking alcohol or caffeine. Taking certain medicines. Age-related hearing loss. It may also be caused by medical conditions such as: Ear or sinus infections. Heart diseases or high blood pressure. Allergies. Mnire's disease. Thyroid problems. Tumors. A weak, bulging blood vessel (aneurysm) near the ear. What increases the risk? The following factors may make you more likely to develop this condition: Exposure to loud noises. Age. Tinnitus is more likely in older individuals. Using alcohol or tobacco. What are the signs or symptoms? The main symptom of tinnitus is hearing a sound when there is no source for that sound. It may sound like: Buzzing. Sizzling. Ringing. Blowing air. Hissing. Whistling. Other sounds may include: Roaring. Running water. A musical note. Tapping. Humming. Symptoms may affect only one ear (unilateral) or both ears (bilateral). How is this diagnosed? Tinnitus is diagnosed based on your symptoms,  your medical history, and a physical exam. Your health care provider may do a thorough hearing test (audiologic exam) if your tinnitus: Is unilateral. Causes hearing difficulties. Lasts 6 months or longer. You may work with a health care provider who specializes in hearing disorders (audiologist). You may be asked questions about your symptoms and how they affect your daily life. You may have other tests done, such as: CT scan. MRI. An imaging test of how blood flows through your blood vessels (angiogram). How is this treated? Treating an underlying medical condition can sometimes make tinnitus go away. If your tinnitus continues, other treatments may include: Therapy and counseling to help you manage the stress of living with tinnitus. Sound generators to mask the tinnitus. These include: Tabletop sound machines that play relaxing sounds to help you fall asleep. Wearable devices that fit in your ear and play sounds or music. Acoustic neural stimulation. This involves using headphones to listen to music that contains an auditory signal. Over time, listening to this signal may change some pathways in your brain and make you less sensitive to tinnitus. This treatment is used for very severe cases when no other treatment is working. Using hearing aids or cochlear implants if your tinnitus is related to hearing loss. Hearing aids are worn in the outer ear. Cochlear implants are surgically placed in the inner ear. Follow these instructions at home: Managing symptoms   When possible, avoid being in loud places and being exposed to loud sounds. Wear hearing protection, such as earplugs, when you are exposed to loud noises. Use a white noise machine, a humidifier, or other devices to mask the sound of tinnitus. Practice techniques  for reducing stress, such as meditation, yoga, or deep breathing. Work with your health care provider if you need help with managing stress. Sleep with your head slightly  raised. This may reduce the impact of tinnitus. General instructions Do not use stimulants, such as nicotine, alcohol, or caffeine. Talk with your health care provider about other stimulants to avoid. Stimulants are substances that can make you feel alert and attentive by increasing certain activities in the body (such as heart rate and blood pressure). These substances may make tinnitus worse. Take over-the-counter and prescription medicines only as told by your health care provider. Try to get plenty of sleep each night. Keep all follow-up visits. This is important. Contact a health care provider if: Your tinnitus continues for 3 weeks or longer without stopping. You develop sudden hearing loss. Your symptoms get worse or do not get better with home care. You feel you are not able to manage the stress of living with tinnitus. Get help right away if: You develop tinnitus after a head injury. You have tinnitus along with any of the following: Dizziness. Nausea and vomiting. Loss of balance. Sudden, severe headache. Vision changes. Facial weakness or weakness of arms or legs. These symptoms may represent a serious problem that is an emergency. Do not wait to see if the symptoms will go away. Get medical help right away. Call your local emergency services (911 in the [place] Do not drive yourself to the hospital. Summary Tinnitus refers to hearing a sound when there is no actual source for that sound. This is often described as ringing in the ears. Symptoms may affect only one ear (unilateral) or both ears (bilateral). Use a white noise machine, a humidifier, or other devices to mask the sound of tinnitus. Do not use stimulants, such as nicotine, alcohol, or caffeine. These substances may make tinnitus worse. This information is not intended to replace advice given to you by your health care provider. Make sure you discuss any questions you have with your health care provider. Document  Revised: 01/07/2020 Document Reviewed: 01/07/2020 Elsevier Patient Education  2022 [employer].

## 2020-12-09 NOTE — Progress Notes (Signed)
Subjective:  Patient ID: Darren Burns, male    DOB: Mar 07, 1975  Age: 45 y.o. MRN: [id number]  CC: Insomnia   HPI Darren Burns is a 45 y.o. year old male who presents today for an acute visit.  Interval History: He is under a lot of stress at work and the last 3 weeks has been the worst. 2 days ago he had chest pain which he thought was different. He also noticed he 'needed more air' to sing which is not normal for him as he sings in the choir. He also noticed some palpitations and noticed tachycardia of 115. Bp is elevated today and he complains of insomnia x4 days  He complains of tinnitus in both ears Past Medical History:  Diagnosis Date   Epididymal mass    left   Frequency of urination    GERD (gastroesophageal reflux disease)    History of exercise intolerance 10/2017   negative ETT, no ischemia   History of Helicobacter pylori infection 07/2017    Past Surgical History:  Procedure Laterality Date   CYSTOSCOPY N/A 11/24/2017   Procedure: FLEX CYSTOSCOPY;  Surgeon: Irine Seal, MD;  Location: [facility];  Service: Urology;  Laterality: N/A;   SCROTAL EXPLORATION Left 11/24/2017   Procedure: LEFT INGUINAL EXPLORATION WITH EXCISION OF EPIDYIMAL MASS;  Surgeon: Irine Seal, MD;  Location: [facility];  Service: Urology;  Laterality: Left;    No family history on file.  Allergies  Allergen Reactions   Ibuprofen Swelling and Other (See Comments)    Lip swelling   Tamiflu [Oseltamivir Phosphate] Nausea And Vomiting    Outpatient Medications Prior to Visit  Medication Sig Dispense Refill   acetaminophen (TYLENOL) 500 MG tablet Take 1,000 mg by mouth every 6 (six) hours as needed for moderate pain or headache.     albuterol (VENTOLIN HFA) 108 (90 Base) MCG/ACT inhaler Inhale 2 puffs into the lungs every 4 (four) hours as needed for wheezing or shortness of breath. 18 g 1   omeprazole (PRILOSEC) 20 MG capsule Take 1 capsule (20 mg total) by mouth daily.  30 capsule 3   polyethylene glycol powder (GLYCOLAX/MIRALAX) powder Take 17 g by mouth daily. 3350 g 1   terbinafine (LAMISIL) 250 MG tablet Take 1 tablet (250 mg total) by mouth daily. 30 tablet 2   No facility-administered medications prior to visit.     ROS Review of Systems  Constitutional:  Negative for activity change and appetite change.  HENT:  Positive for tinnitus. Negative for sinus pressure and sore throat.   Eyes:  Negative for visual disturbance.  Respiratory:  Negative for cough, chest tightness and shortness of breath.   Cardiovascular:  Positive for palpitations. Negative for chest pain and leg swelling.  Gastrointestinal:  Negative for abdominal distention, abdominal pain, constipation and diarrhea.  Endocrine: Negative.   Genitourinary:  Negative for dysuria.  Musculoskeletal:  Negative for joint swelling and myalgias.  Skin:  Negative for rash.  Allergic/Immunologic: Negative.   Neurological:  Negative for weakness, light-headedness and numbness.  Psychiatric/Behavioral:  Negative for dysphoric mood and suicidal ideas.    Objective:  BP (!) 147/96   Pulse 78   Ht 5\' 5"  (1.651 m)   Wt 204 lb (92.5 kg)   SpO2 99%   BMI 33.95 kg/m   BP/Weight 12/09/2020 10/10/2019 8/36/6294  Systolic BP 765 - -  Diastolic BP 96 - -  Wt. (Lbs) 204 - 198  BMI 33.95 32.95 -      Physical  Exam Constitutional:      Appearance: He is well-developed.  HENT:     Right Ear: There is no impacted cerumen.     Left Ear: There is impacted cerumen.  Cardiovascular:     Rate and Rhythm: Normal rate.     Heart sounds: Normal heart sounds. No murmur heard. Pulmonary:     Effort: Pulmonary effort is normal.     Breath sounds: Normal breath sounds. No wheezing or rales.  Chest:     Chest wall: No tenderness.  Abdominal:     General: Bowel sounds are normal. There is no distension.     Palpations: Abdomen is soft. There is no mass.     Tenderness: There is no abdominal  tenderness.  Musculoskeletal:        General: Normal range of motion.     Right lower leg: No edema.     Left lower leg: No edema.  Neurological:     Mental Status: He is alert and oriented to person, place, and time.  Psychiatric:        Mood and Affect: Mood normal.    CMP Latest Ref Rng & Units 04/26/2019 01/02/2018 08/04/2017  Glucose 65 - 99 mg/dL 91 80 98  BUN 6 - 24 mg/dL 16 15 10   Creatinine 0.76 - 1.27 mg/dL 0.77 0.92 0.93  Sodium 134 - 144 mmol/L 140 139 142  Potassium 3.5 - 5.2 mmol/L 4.3 4.3 4.2  Chloride 96 - 106 mmol/L 103 100 104  CO2 20 - 29 mmol/L 24 21 25   Calcium 8.7 - 10.2 mg/dL 9.6 9.7 9.4  Total Protein 6.0 - 8.5 g/dL 6.9 7.4 7.3  Total Bilirubin 0.0 - 1.2 mg/dL 0.2 0.2 0.2  Alkaline Phos 39 - 117 IU/L 100 102 101  AST 0 - 40 IU/L 24 26 23   ALT 0 - 44 IU/L 29 37 33    Lipid Panel     Component Value Date/Time   CHOL 179 04/26/2019 1705   TRIG 193 (H) 04/26/2019 1705   HDL 41 04/26/2019 1705   CHOLHDL 4.4 04/26/2019 1705   CHOLHDL 4.4 12/22/2015 0905   VLDL 28 12/22/2015 0905   LDLCALC 104 (H) 04/26/2019 1705    CBC    Component Value Date/Time   WBC 6.2 11/22/2017 1413   RBC 5.28 11/22/2017 1413   HGB 15.6 11/22/2017 1413   HCT 45.3 11/22/2017 1413   PLT 284 11/22/2017 1413   MCV 85.8 11/22/2017 1413   MCH 29.5 11/22/2017 1413   MCHC 34.4 11/22/2017 1413   RDW 13.7 11/22/2017 1413   LYMPHSABS 2.0 05/27/2014 2008   MONOABS 0.5 05/27/2014 2008   EOSABS 0.3 05/27/2014 2008   BASOSABS 0.0 05/27/2014 2008    Lab Results  Component Value Date   HGBA1C 5.4 04/26/2019    Assessment & Plan:  1. Palpitations EKG is normal with no ischemic changes Could be secondary to anxiety and stress at work Will send of thyroid panel to exclude underlying cause - Basic Metabolic Panel - TSH - T4, free - CBC with Differential/Platelet  2. Atypical chest pain See #1 above  3. Anxiety He is currently under stress at work Hydroxyzine will help  with anxiety and insomnia - hydrOXYzine (ATARAX/VISTARIL) 25 MG tablet; Take 1 tablet (25 mg total) by mouth at bedtime as needed.  Dispense: 30 tablet; Refill: 2  4. Tinnitus of both ears Advised to use Debrox for cerumen impaction could contribute to tinnitus  5. Elevated blood  pressure reading He has no diagnosis of HTN DASH Diet, continue lifestyle modifications Will reassess at next visit   Meds ordered this encounter  Medications   hydrOXYzine (ATARAX/VISTARIL) 25 MG tablet    Sig: Take 1 tablet (25 mg total) by mouth at bedtime as needed.    Dispense:  30 tablet    Refill:  2    Follow-up: Return if symptoms worsen or fail to improve.       Charlott Rakes, MD, FAAFP. [facility] [place], [place]   12/09/2020, 12:18 PM

## 2020-12-10 LAB — CBC WITH DIFFERENTIAL/PLATELET
Basophils Absolute: 0 10*3/uL (ref 0.0–0.2)
Basos: 0 %
EOS (ABSOLUTE): 0.1 10*3/uL (ref 0.0–0.4)
Eos: 2 %
Hematocrit: 48.2 % (ref 37.5–51.0)
Hemoglobin: 16.5 g/dL (ref 13.0–17.7)
Immature Grans (Abs): 0 10*3/uL (ref 0.0–0.1)
Immature Granulocytes: 0 %
Lymphocytes Absolute: 1.7 10*3/uL (ref 0.7–3.1)
Lymphs: 31 %
MCH: 30.2 pg (ref 26.6–33.0)
MCHC: 34.2 g/dL (ref 31.5–35.7)
MCV: 88 fL (ref 79–97)
Monocytes Absolute: 0.6 10*3/uL (ref 0.1–0.9)
Monocytes: 11 %
Neutrophils Absolute: 3.1 10*3/uL (ref 1.4–7.0)
Neutrophils: 56 %
Platelets: 276 10*3/uL (ref 150–450)
RBC: 5.47 x10E6/uL (ref 4.14–5.80)
RDW: 13.3 % (ref 11.6–15.4)
WBC: 5.6 10*3/uL (ref 3.4–10.8)

## 2020-12-10 LAB — BASIC METABOLIC PANEL
BUN/Creatinine Ratio: 11 (ref 9–20)
BUN: 9 mg/dL (ref 6–24)
CO2: 23 mmol/L (ref 20–29)
Calcium: 9.4 mg/dL (ref 8.7–10.2)
Chloride: 101 mmol/L (ref 96–106)
Creatinine, Ser: 0.84 mg/dL (ref 0.76–1.27)
Glucose: 92 mg/dL (ref 70–99)
Potassium: 4.5 mmol/L (ref 3.5–5.2)
Sodium: 140 mmol/L (ref 134–144)
eGFR: 108 mL/min/{1.73_m2} (ref >59)

## 2020-12-10 LAB — TSH: TSH: 1.92 u[IU]/mL (ref 0.450–4.500)

## 2020-12-10 LAB — T4, FREE: Free T4: 1.32 ng/dL (ref 0.82–1.77)

## 2020-12-12 ENCOUNTER — Telehealth: Payer: Self-pay

## 2020-12-12 NOTE — Telephone Encounter (Signed)
-----   Message from Charlott Rakes, MD sent at 12/10/2020  8:46 AM EDT ----- Please inform the patient that labs are normal. Thank you.

## 2020-12-12 NOTE — Telephone Encounter (Signed)
Patient name and DOB has been verified Patient was informed of lab results. Patient had no questions.  

## 2020-12-16 ENCOUNTER — Other Ambulatory Visit: Payer: Self-pay

## 2021-12-07 ENCOUNTER — Encounter: Payer: Self-pay | Admitting: Physician Assistant

## 2021-12-07 ENCOUNTER — Telehealth: Payer: Self-pay | Admitting: Emergency Medicine

## 2021-12-07 ENCOUNTER — Ambulatory Visit: Payer: Self-pay | Admitting: Physician Assistant

## 2021-12-07 VITALS — BP 156/103 | HR 78 | Ht 65.0 in | Wt 202.0 lb

## 2021-12-07 DIAGNOSIS — M25512 Pain in left shoulder: Secondary | ICD-10-CM

## 2021-12-07 DIAGNOSIS — R030 Elevated blood-pressure reading, without diagnosis of hypertension: Secondary | ICD-10-CM

## 2021-12-07 DIAGNOSIS — F411 Generalized anxiety disorder: Secondary | ICD-10-CM

## 2021-12-07 DIAGNOSIS — F439 Reaction to severe stress, unspecified: Secondary | ICD-10-CM

## 2021-12-07 DIAGNOSIS — K219 Gastro-esophageal reflux disease without esophagitis: Secondary | ICD-10-CM

## 2021-12-07 IMAGING — CR DG ANKLE COMPLETE 3+V*L*
3 series · 3 of 3 positions shown · non-contrast
Comparison: None.

CLINICAL DATA: Ankle injury

EXAM:
LEFT ANKLE COMPLETE - 3+ VIEW

[ankle ap]
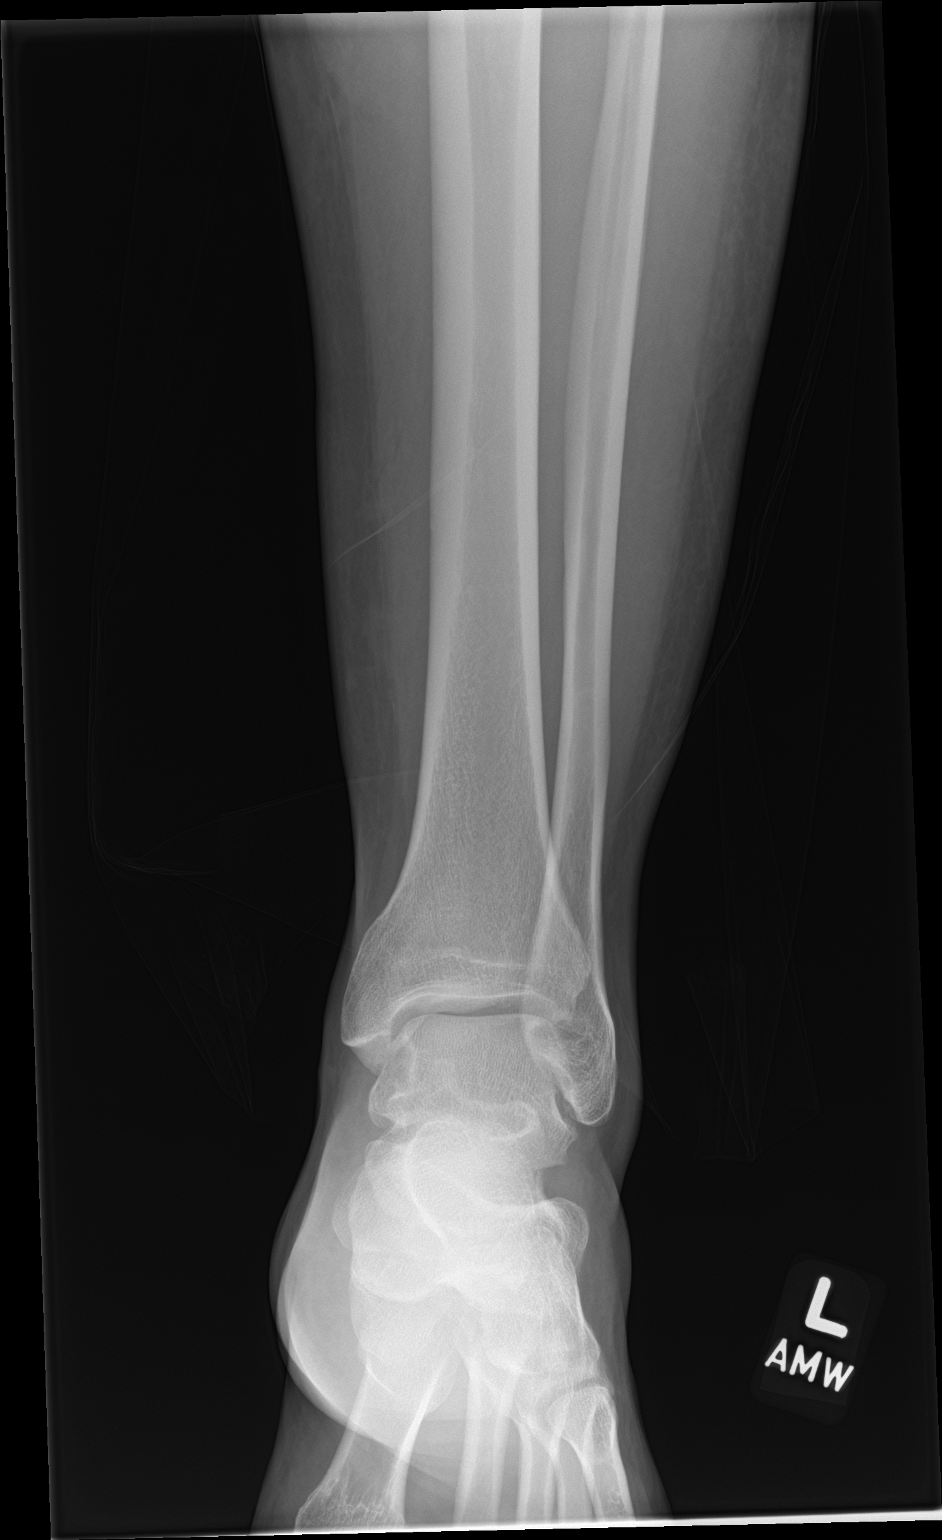

[ankle obl]
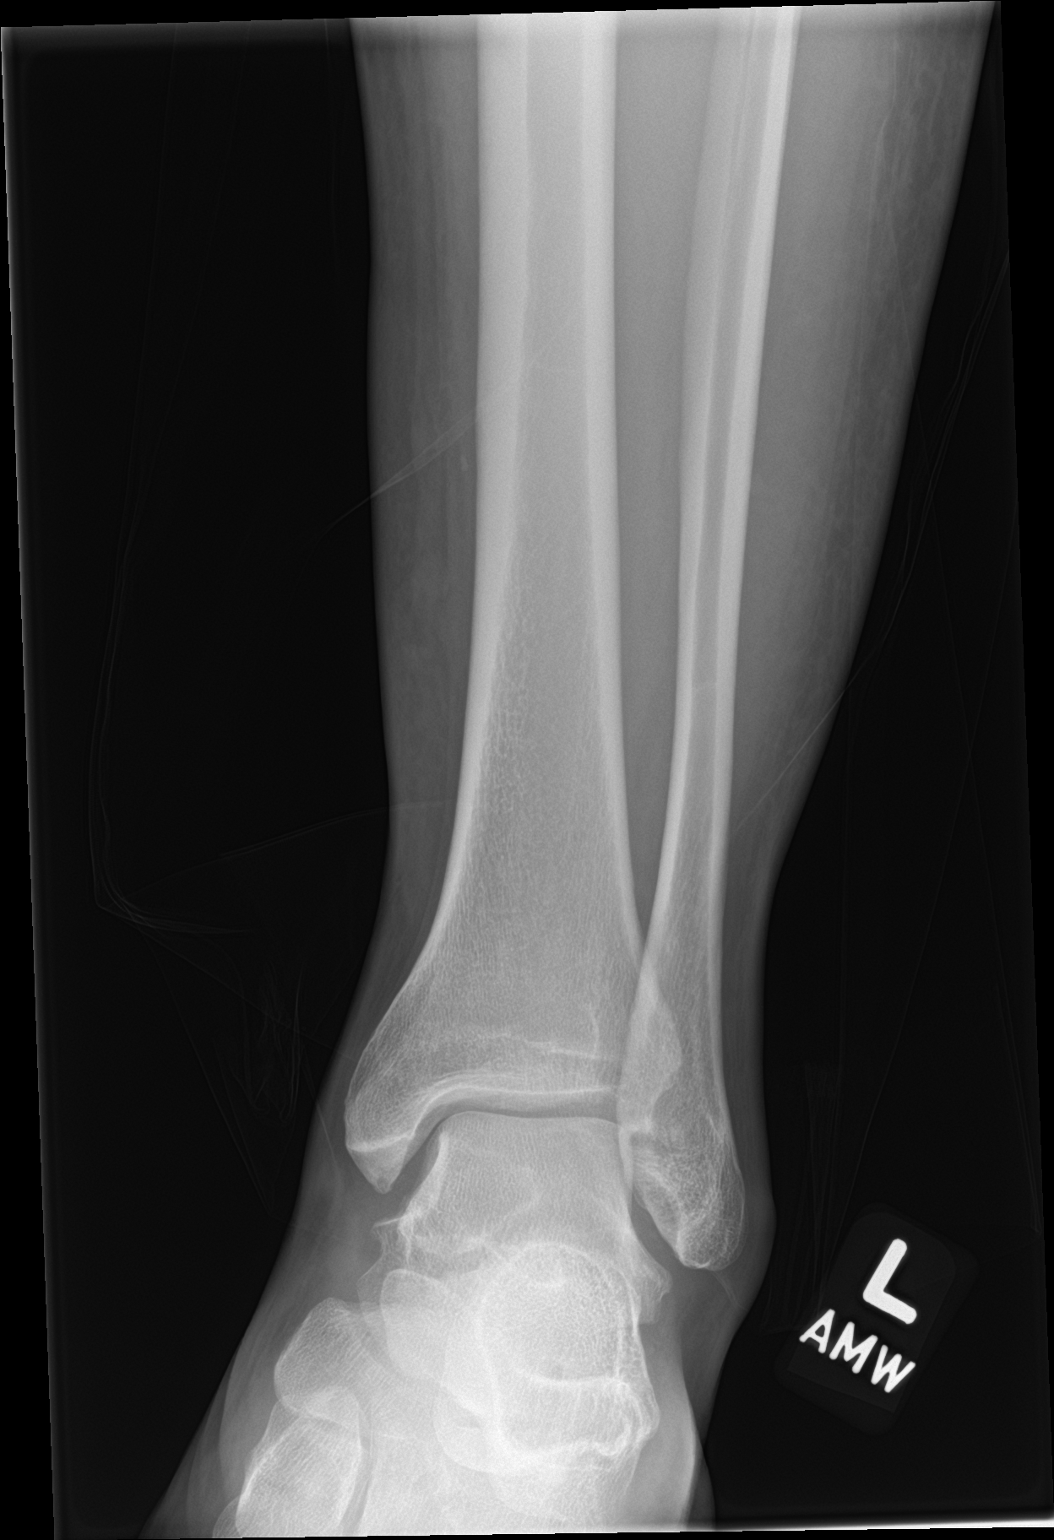

[ankle lat]
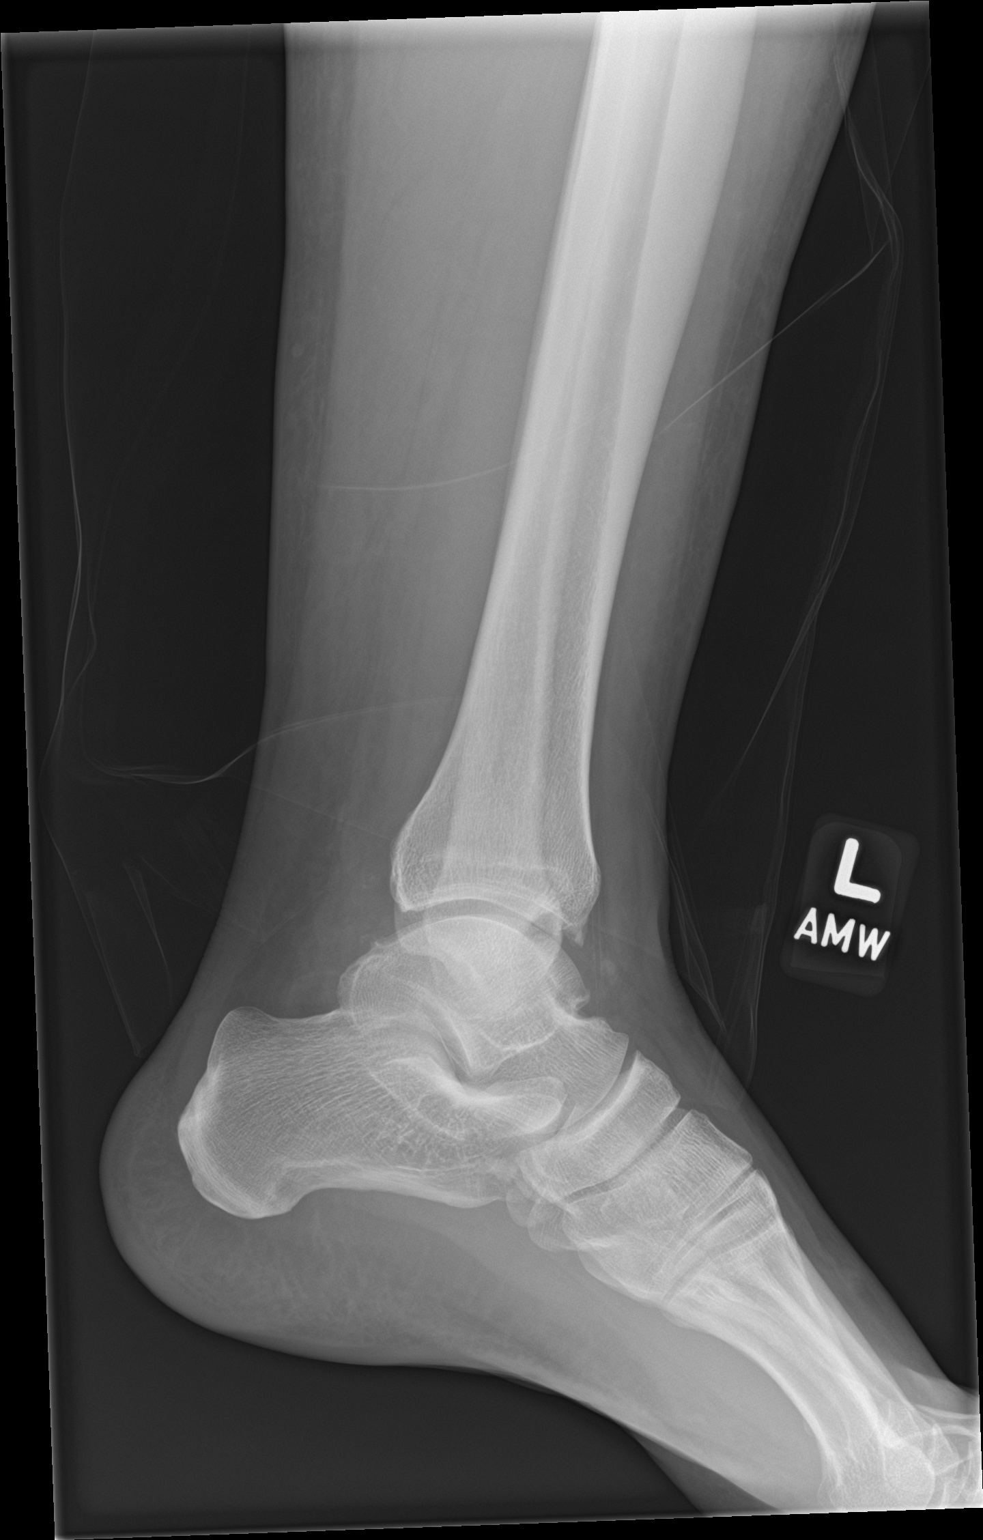

[3 of 3 positions shown; findings below may reference images not displayed]

FINDINGS: There is no evidence of fracture, dislocation, or joint effusion.
There is no evidence of arthropathy or other focal bone abnormality.
Soft tissues are unremarkable.
IMPRESSION: Negative.

## 2021-12-07 NOTE — Progress Notes (Signed)
Established Patient Office Visit  Subjective   Patient ID: Darren Burns, male    DOB: 1976-02-12  Age: 46 y.o. MRN: [id number]  Chief Complaint  Patient presents with   GI Problem    Pressure on left side after eating and drinking, Pt has concern Family Hx of colon cancer    Shoulder Pain    Left Shoulder, Shoulder pain rated 6- 8     States that he will get pressure on the left side of his abdomen occassionaly after eating and drinking for the past "7 years at least"   Worse if he is stressed, but does happen when stress levels are low.  States that his stress has been elevated over the past month and that his pain after eating has been fairly frequent.  Has taken omeprazole and miralax in the past with a little relief.  States that he will have  days of constipation and sometimes diarrhea, this week with loose stools three days and then yesterday felt constipated. States that this is normal for him.  States that he does sleep about 5 hours a night due to "being busy", but does state that he is able to go to sleep if he wants. Does state that he does feel panic sometimes in the middle of night.  States that he drinks 2 bottles of water a day, drinks 2 cups of water.  States that he checks his BP at home and will check on occasion, but has not checked it home recently. Denies hypertensive symptoms  States that his left shoulder has been hurting since he collided with another person while  playing baseball 4 months ago, hurts occasionally with rotation.  States that he does sleep on his left side. Denies numbness / tingling    Past Medical History:  Diagnosis Date   Epididymal mass    left   Frequency of urination    GERD (gastroesophageal reflux disease)    History of exercise intolerance 10/2017   negative ETT, no ischemia   History of Helicobacter pylori infection 07/2017   Social History   Socioeconomic History   Marital status: Married    Spouse name:  Not on file   Number of children: Not on file   Years of education: Not on file   Highest education level: Not on file  Occupational History   Not on file  Tobacco Use   Smoking status: Former    Years: 10.00    Types: Cigarettes    Quit date: 02/15/1998    Years since quitting: 23.8   Smokeless tobacco: Never  Vaping Use   Vaping Use: Never used  Substance and Sexual Activity   Alcohol use: No   Drug use: Not Currently    Types: Cocaine, "Crack" cocaine, Marijuana    Comment: 11-22-2017 per pt last cocaine 2001   Sexual activity: Yes  Other Topics Concern   Not on file  Social History Narrative   Not on file   Social Determinants of Health   Financial Resource Strain: Not on file  Food Insecurity: Not on file  Transportation Needs: Not on file  Physical Activity: Not on file  Stress: Not on file  Social Connections: Not on file  Intimate Partner Violence: Not on file   History reviewed. No pertinent family history. Allergies  Allergen Reactions   Ibuprofen Swelling and Other (See Comments)    Lip swelling   Tamiflu [Oseltamivir Phosphate] Nausea And Vomiting    Review of Systems  Constitutional: Negative.   HENT: Negative.    Eyes: Negative.   Respiratory:  Negative for shortness of breath.   Cardiovascular:  Negative for chest pain, palpitations and leg swelling.  Gastrointestinal:  Positive for abdominal pain, constipation and diarrhea. Negative for nausea and vomiting.  Genitourinary: Negative.   Musculoskeletal:  Positive for joint pain and myalgias.  Skin: Negative.   Neurological: Negative.   Endo/Heme/Allergies: Negative.   Psychiatric/Behavioral:  Negative for depression. The patient is nervous/anxious and has insomnia.       Objective:     BP (!) 156/103 (BP Location: Right Arm, Patient Position: Sitting, Cuff Size: Normal)   Pulse 78   Ht '5\' 5"'$  (1.651 m)   Wt 202 lb (91.6 kg)   BMI 33.61 kg/m    Physical Exam Vitals and nursing note  reviewed.  Constitutional:      Appearance: Normal appearance.  HENT:     Head: Normocephalic and atraumatic.     Right Ear: External ear normal.     Left Ear: External ear normal.     Nose: Nose normal.     Mouth/Throat:     Mouth: Mucous membranes are moist.     Pharynx: Oropharynx is clear.  Eyes:     Extraocular Movements: Extraocular movements intact.     Conjunctiva/sclera: Conjunctivae normal.     Pupils: Pupils are equal, round, and reactive to light.  Cardiovascular:     Rate and Rhythm: Normal rate and regular rhythm.     Pulses: Normal pulses.     Heart sounds: Normal heart sounds.  Pulmonary:     Effort: Pulmonary effort is normal.     Breath sounds: Normal breath sounds.  Musculoskeletal:     Left shoulder: Tenderness present. No swelling. Decreased range of motion.     Cervical back: Normal range of motion and neck supple.     Comments: Unable to perform full orthopedic exam due to limited space in screening van  Skin:    General: Skin is warm and dry.  Neurological:     General: No focal deficit present.     Mental Status: He is alert and oriented to person, place, and time.  Psychiatric:        Mood and Affect: Mood normal.        Behavior: Behavior normal.        Thought Content: Thought content normal.        Judgment: Judgment normal.       Assessment & Plan:   Problem List Items Addressed This Visit       Digestive   GERD (gastroesophageal reflux disease)   Other Visit Diagnoses     Stress    -  Primary   Anxiety state       Acute pain of left shoulder       Elevated blood pressure reading in office without diagnosis of hypertension          1. Stress Patient encouraged to work on improving sleep hygiene, patient education given on stress relieving activities.  2. Anxiety state   3. Gastroesophageal reflux disease without esophagitis History of GERD, patient's presenting symptoms, more than likely due to combination of increased  stress, dehydration, improper sleep hygiene and possible IBS.  Patient education given on lifestyle modifications, red flags given for prompt reevaluation  4. Acute pain of left shoulder Patient education given on supportive care, red flags given for prompt reevaluation  5. Elevated blood pressure reading in office  without diagnosis of hypertension Patient encouraged to check blood pressure at home, keep a written log and have available for all office visits.  No AVS created, no printer available on mobile screening van, patient does not have access to MyChart.  Patient education given with teach back method.   I have reviewed the patient's medical history (PMH, PSH, Social History, Family History, Medications, and allergies) , and have been updated if relevant. I spent 30 minutes reviewing chart and  face to face time with patient.    Return if symptoms worsen or fail to improve.    Loraine Grip Mayers, PA-C

## 2021-12-07 NOTE — Telephone Encounter (Signed)
Copied from [place] [id number]. Topic: General - Other >> Dec 07, 2021  8:52 AM Leilani Able wrote: Reason for CRM: Please FU wioth pt, he and his wife had orange card sent to wrong address, office corrected wife and she got hers two wks ago, he still has not gotten his, address has been corrected as well at [address] [place]. Pt feels like they must have just remailed wife's only pls fu at [phone]

## 2021-12-08 ENCOUNTER — Encounter: Payer: Self-pay | Admitting: Physician Assistant

## 2022-01-14 ENCOUNTER — Ambulatory Visit: Payer: Self-pay | Admitting: *Deleted

## 2022-01-14 NOTE — Telephone Encounter (Signed)
  Chief Complaint: left side abdominal pain , weight loss, chest pain at times  Symptoms: left sided abdominal pain sharp at times. Nausea feels weak, difficutly tolerating eating and drinking. Weight loss noted. Reports able to fit pants he could not fit before . Approx 1 month of weight loss. Unknown how much weight lost. Reports apple watch HR up and down 90-98 at times. BP 128/90 this am. Reports feels lips and face N/T at times not now. Chest pain noted and dizziness at times not now.  Frequency: greater than a month  Pertinent Negatives: Patient denies chest pain no difficulty breathing reported no fever no report of passing out  Disposition: '[]'$ ED /'[]'$ Urgent Care (no appt availability in office) / '[]'$ Appointment(In office/virtual)/ '[]'$  [facility]/ '[]'$ Home Care/ '[]'$ Refused Recommended Disposition /'[x]'$ [facility]/ '[]'$  Follow-up with PCP Additional Notes:   No available appt . Recommended if chest pain of N/T noted in face go to ED. Recommended mobile bus today for evaluation.      Reason for Disposition  [1] MODERATE pain (e.g., interferes with normal activities) AND [2] pain comes and goes (cramps) AND [3] present > 24 hours  (Exception: Pain with Vomiting or Diarrhea - see that Guideline.)  Answer Assessment - Initial Assessment Questions 1. LOCATION: "Where does it hurt?"      Left side of abdomen 2. RADIATION: "Does the pain shoot anywhere else?" (e.g., chest, back)     Sharp pains 3. ONSET: "When did the pain begin?" (Minutes, hours or days ago)      Greater than a month 4. SUDDEN: "Gradual or sudden onset?"     Hx constipation gradual  5. PATTERN "Does the pain come and go, or is it constant?"    - If it comes and goes: "How long does it last?" "Do you have pain now?"     (Note: Comes and goes means the pain is intermittent. It goes away completely between bouts.)    - If constant: "Is it getting better, staying the same, or getting worse?"      (Note: Constant  means the pain never goes away completely; most serious pain is constant and gets worse.)      Bloated and sharp pain , cones and goes 6. SEVERITY: "How bad is the pain?"  (e.g., Scale 1-10; mild, moderate, or severe)    - MILD (1-3): Doesn't interfere with normal activities, abdomen soft and not tender to touch.     - MODERATE (4-7): Interferes with normal activities or awakens from sleep, abdomen tender to touch.     - SEVERE (8-10): Excruciating pain, doubled over, unable to do any normal activities.       Na  7. RECURRENT SYMPTOM: "Have you ever had this type of stomach pain before?" If Yes, ask: "When was the last time?" and "What happened that time?"      Yes  8. CAUSE: "What do you think is causing the stomach pain?"     Not sure  9. RELIEVING/AGGRAVATING FACTORS: "What makes it better or worse?" (e.g., antacids, bending or twisting motion, bowel movement)     na 10. OTHER SYMPTOMS: "Do you have any other symptoms?" (e.g., back pain, diarrhea, fever, urination pain, vomiting)       Nausea , chills , weight loss. Chest pain dizziness at times not now, lips / face feels N/T at times not now.  Protocols used: Abdominal Pain - Male-A-AH

## 2022-03-16 ENCOUNTER — Telehealth: Payer: Self-pay | Admitting: Emergency Medicine

## 2022-03-16 NOTE — Telephone Encounter (Signed)
Copied from [place] [phone]. Topic: General - Inquiry >> Mar 16, 2022 10:47 AM Marcellus Scott wrote: Reason for CRM: Pt wife is calling to follow up on the orange card/financial assistance application. She stated that she brought both hers and his; she has been approved but still has not heard back regarding her husbands.  Please return call with Spanish Interpreter. Please advise.

## 2022-03-18 ENCOUNTER — Ambulatory Visit: Payer: Self-pay | Admitting: *Deleted

## 2022-03-18 NOTE — Telephone Encounter (Addendum)
Patient requesting appointment for tomorrow unable to go to Mobile unit today. Appointment scheduled for tomorrow at [facility].

## 2022-03-18 NOTE — Telephone Encounter (Signed)
  Chief Complaint: cough Symptoms: dry cough, chest pain with cough, nauseous with cough, possible wheezing Frequency: 3 weeks Pertinent Negatives: Patient denies fever Disposition: '[]'$ ED /'[]'$ Urgent Care (no appt availability in office) / '[]'$ Appointment(In office/virtual)/ '[]'$  [facility]/ '[]'$ Home Care/ '[]'$ Refused Recommended Disposition /'[x]'$ [facility]/ '[]'$  Follow-up with PCP Additional Notes: Patient has been using inhaler that belong to spouse. Patient states he has had cough for 3 weeks- chest is sore and he may have wheezing. No sputum coming up- but feels nauseated with coughing

## 2022-03-18 NOTE — Progress Notes (Deleted)
Patient ID: Darren Burns, male    DOB: 11/11/1975  MRN: [id number]  CC: No chief complaint on file.   Subjective: Darren Burns is a 47 y.o. male who presents for  His concerns today include:  Coughing  03/18/2022 per triage RN note: Chief Complaint: cough Symptoms: dry cough, chest pain with cough, nauseous with cough, possible wheezing Frequency: 3 weeks Pertinent Negatives: Patient denies fever Disposition: '[]'$ ED /'[]'$ Urgent Care (no appt availability in office) / '[]'$ Appointment(In office/virtual)/ '[]'$  [facility]/ '[]'$ Home Care/ '[]'$ Refused Recommended Disposition /'[x]'$ [facility]/ '[]'$  Follow-up with PCP Additional Notes: Patient has been using inhaler that belong to spouse. Patient states he has had cough for 3 weeks- chest is sore and he may have wheezing. No sputum coming up- but feels nauseated with coughing   Reason for Disposition  [1] Continuous (nonstop) coughing interferes with work or school AND [2] no improvement using cough treatment per Care Advice  Answer Assessment - Initial Assessment Questions 1. ONSET: "When did the cough begin?"      3 weeks ago 2. SEVERITY: "How bad is the cough today?"      Sometimes has trouble breathing, chest sore 3. SPUTUM: "Describe the color of your sputum" (none, dry cough; clear, white, yellow, green)     No- but feels wants to vomit 4. HEMOPTYSIS: "Are you coughing up any blood?" If so ask: "How much?" (flecks, streaks, tablespoons, etc.)     no 5. DIFFICULTY BREATHING: "Are you having difficulty breathing?" If Yes, ask: "How bad is it?" (e.g., mild, moderate, severe)    - MILD: No SOB at rest, mild SOB with walking, speaks normally in sentences, can lie down, no retractions, pulse < 100.    - MODERATE: SOB at rest, SOB with minimal exertion and prefers to sit, cannot lie down flat, speaks in phrases, mild retractions, audible wheezing, pulse 100-120.    - SEVERE: Very SOB at rest, speaks in  single words, struggling to breathe, sitting hunched forward, retractions, pulse > 120      Not SOB 6. FEVER: "Do you have a fever?" If Yes, ask: "What is your temperature, how was it measured, and when did it start?"     no 7. CARDIAC HISTORY: "Do you have any history of heart disease?" (e.g., heart attack, congestive heart failure)      No- BP 1507/95 8. LUNG HISTORY: "Do you have any history of lung disease?"  (e.g., pulmonary embolus, asthma, emphysema)     no 9. PE RISK FACTORS: "Do you have a history of blood clots?" (or: recent major surgery, recent prolonged travel, bedridden)     no 10. OTHER SYMPTOMS: "Do you have any other symptoms?" (e.g., runny nose, wheezing, chest pain)       Runny nose, chest sore, wheezing   Today's visit 03/19/2022: Has appt 03/24/22 with [facility]   Patient Active Problem List   Diagnosis Date Noted   Right carpal tunnel syndrome 08/14/2019   Left carpal tunnel syndrome 08/14/2019   GERD (gastroesophageal reflux disease) 08/17/2016   Chest pain 11/10/2014   Pain in the chest    Dental cavities 10/25/2013   Annual physical exam 10/25/2013     Current Outpatient Medications on File Prior to Visit  Medication Sig Dispense Refill   acetaminophen (TYLENOL) 500 MG tablet Take 1,000 mg by mouth every 6 (six) hours as needed for moderate pain or headache.     albuterol (VENTOLIN HFA) 108 (90 Base) MCG/ACT inhaler Inhale 2  puffs into the lungs every 4 (four) hours as needed for wheezing or shortness of breath. 18 g 1   hydrOXYzine (ATARAX/VISTARIL) 25 MG tablet Take 1 tablet (25 mg total) by mouth at bedtime as needed. (Patient not taking: Reported on 12/07/2021) 30 tablet 2   omeprazole (PRILOSEC) 20 MG capsule Take 1 capsule (20 mg total) by mouth daily. 30 capsule 3   polyethylene glycol powder (GLYCOLAX/MIRALAX) powder Take 17 g by mouth daily. (Patient not taking: Reported on 12/07/2021) 3350 g 1   terbinafine (LAMISIL) 250 MG tablet Take 1 tablet (250 mg  total) by mouth daily. (Patient not taking: Reported on 12/07/2021) 30 tablet 2   No current facility-administered medications on file prior to visit.    Allergies  Allergen Reactions   Ibuprofen Swelling and Other (See Comments)    Lip swelling   Tamiflu [Oseltamivir Phosphate] Nausea And Vomiting    Social History   Socioeconomic History   Marital status: Married    Spouse name: Not on file   Number of children: Not on file   Years of education: Not on file   Highest education level: Not on file  Occupational History   Not on file  Tobacco Use   Smoking status: Former    Years: 10.00    Types: Cigarettes    Quit date: 02/15/1998    Years since quitting: 24.1   Smokeless tobacco: Never  Vaping Use   Vaping Use: Never used  Substance and Sexual Activity   Alcohol use: No   Drug use: Not Currently    Types: Cocaine, "Crack" cocaine, Marijuana    Comment: 11-22-2017 per pt last cocaine 2001   Sexual activity: Yes  Other Topics Concern   Not on file  Social History Narrative   Not on file   Social Determinants of Health   Financial Resource Strain: Not on file  Food Insecurity: Not on file  Transportation Needs: Not on file  Physical Activity: Not on file  Stress: Not on file  Social Connections: Not on file  Intimate Partner Violence: Not on file    No family history on file.  Past Surgical History:  Procedure Laterality Date   CYSTOSCOPY N/A 11/24/2017   Procedure: FLEX CYSTOSCOPY;  Surgeon: Irine Seal, MD;  Location: [facility];  Service: Urology;  Laterality: N/A;   SCROTAL EXPLORATION Left 11/24/2017   Procedure: LEFT INGUINAL EXPLORATION WITH EXCISION OF EPIDYIMAL MASS;  Surgeon: Irine Seal, MD;  Location: [facility];  Service: Urology;  Laterality: Left;    ROS: Review of Systems Negative except as stated above  PHYSICAL EXAM: There were no vitals taken for this visit.  Physical Exam  {male adult master:310786} {male adult master:310785}      Latest Ref Rng & Units 12/09/2020   12:21 PM 04/26/2019    5:05 PM 01/02/2018    4:58 PM  CMP  Glucose 70 - 99 mg/dL 92  91  80   BUN 6 - 24 mg/dL '9  16  15   '$ Creatinine 0.76 - 1.27 mg/dL 0.84  0.77  0.92   Sodium 134 - 144 mmol/L 140  140  139   Potassium 3.5 - 5.2 mmol/L 4.5  4.3  4.3   Chloride 96 - 106 mmol/L 101  103  100   CO2 20 - 29 mmol/L '23  24  21   '$ Calcium 8.7 - 10.2 mg/dL 9.4  9.6  9.7   Total Protein 6.0 - 8.5 g/dL  6.9  7.4   Total Bilirubin 0.0 - 1.2 mg/dL  0.2  0.2   Alkaline Phos 39 - 117 IU/L  100  102   AST 0 - 40 IU/L  24  26   ALT 0 - 44 IU/L  29  37    Lipid Panel     Component Value Date/Time   CHOL 179 04/26/2019 1705   TRIG 193 (H) 04/26/2019 1705   HDL 41 04/26/2019 1705   CHOLHDL 4.4 04/26/2019 1705   CHOLHDL 4.4 12/22/2015 0905   VLDL 28 12/22/2015 0905   LDLCALC 104 (H) 04/26/2019 1705    CBC    Component Value Date/Time   WBC 5.6 12/09/2020 1221   WBC 6.2 11/22/2017 1413   RBC 5.47 12/09/2020 1221   RBC 5.28 11/22/2017 1413   HGB 16.5 12/09/2020 1221   HCT 48.2 12/09/2020 1221   PLT 276 12/09/2020 1221   MCV 88 12/09/2020 1221   MCH 30.2 12/09/2020 1221   MCH 29.5 11/22/2017 1413   MCHC 34.2 12/09/2020 1221   MCHC 34.4 11/22/2017 1413   RDW 13.3 12/09/2020 1221   LYMPHSABS 1.7 12/09/2020 1221   MONOABS 0.5 05/27/2014 2008   EOSABS 0.1 12/09/2020 1221   BASOSABS 0.0 12/09/2020 1221    ASSESSMENT AND PLAN:  There are no diagnoses linked to this encounter.   Patient was given the opportunity to ask questions.  Patient verbalized understanding of the plan and was able to repeat key elements of the plan. Patient was given clear instructions to go to Emergency Department or return to medical center if symptoms don't improve, worsen, or new problems develop.The patient verbalized understanding.   No orders of the defined types were placed in this encounter.    Requested Prescriptions    No prescriptions requested or ordered in  this encounter    No follow-ups on file.  Camillia Herter, NP

## 2022-03-18 NOTE — Telephone Encounter (Signed)
Reason for Disposition  [1] Continuous (nonstop) coughing interferes with work or school AND [2] no improvement using cough treatment per Care Advice  Answer Assessment - Initial Assessment Questions 1. ONSET: "When did the cough begin?"      3 weeks ago 2. SEVERITY: "How bad is the cough today?"      Sometimes has trouble breathing, chest sore 3. SPUTUM: "Describe the color of your sputum" (none, dry cough; clear, white, yellow, green)     No- but feels wants to vomit 4. HEMOPTYSIS: "Are you coughing up any blood?" If so ask: "How much?" (flecks, streaks, tablespoons, etc.)     no 5. DIFFICULTY BREATHING: "Are you having difficulty breathing?" If Yes, ask: "How bad is it?" (e.g., mild, moderate, severe)    - MILD: No SOB at rest, mild SOB with walking, speaks normally in sentences, can lie down, no retractions, pulse < 100.    - MODERATE: SOB at rest, SOB with minimal exertion and prefers to sit, cannot lie down flat, speaks in phrases, mild retractions, audible wheezing, pulse 100-120.    - SEVERE: Very SOB at rest, speaks in single words, struggling to breathe, sitting hunched forward, retractions, pulse > 120      Not SOB 6. FEVER: "Do you have a fever?" If Yes, ask: "What is your temperature, how was it measured, and when did it start?"     no 7. CARDIAC HISTORY: "Do you have any history of heart disease?" (e.g., heart attack, congestive heart failure)      No- BP 1507/95 8. LUNG HISTORY: "Do you have any history of lung disease?"  (e.g., pulmonary embolus, asthma, emphysema)     no 9. PE RISK FACTORS: "Do you have a history of blood clots?" (or: recent major surgery, recent prolonged travel, bedridden)     no 10. OTHER SYMPTOMS: "Do you have any other symptoms?" (e.g., runny nose, wheezing, chest pain)       Runny nose, chest sore, wheezing  Protocols used: Cough - Acute Non-Productive-A-AH

## 2022-03-19 ENCOUNTER — Encounter: Payer: Self-pay | Admitting: Family Medicine

## 2022-03-19 ENCOUNTER — Other Ambulatory Visit: Payer: Self-pay | Admitting: Pharmacist

## 2022-03-19 ENCOUNTER — Other Ambulatory Visit: Payer: Self-pay

## 2022-03-19 ENCOUNTER — Ambulatory Visit: Payer: Self-pay | Attending: Family | Admitting: Family Medicine

## 2022-03-19 ENCOUNTER — Ambulatory Visit: Payer: Self-pay | Admitting: Family

## 2022-03-19 VITALS — BP 135/97 | HR 82 | Temp 98.3°F | Wt 204.8 lb

## 2022-03-19 DIAGNOSIS — R0982 Postnasal drip: Secondary | ICD-10-CM

## 2022-03-19 DIAGNOSIS — Z13228 Encounter for screening for other metabolic disorders: Secondary | ICD-10-CM

## 2022-03-19 DIAGNOSIS — Z1159 Encounter for screening for other viral diseases: Secondary | ICD-10-CM

## 2022-03-19 DIAGNOSIS — Z23 Encounter for immunization: Secondary | ICD-10-CM

## 2022-03-19 DIAGNOSIS — B351 Tinea unguium: Secondary | ICD-10-CM

## 2022-03-19 DIAGNOSIS — Z131 Encounter for screening for diabetes mellitus: Secondary | ICD-10-CM

## 2022-03-19 DIAGNOSIS — Z1211 Encounter for screening for malignant neoplasm of colon: Secondary | ICD-10-CM

## 2022-03-19 DIAGNOSIS — M62838 Other muscle spasm: Secondary | ICD-10-CM

## 2022-03-19 MED ORDER — CETIRIZINE HCL 10 MG PO TABS
10.0000 mg | ORAL_TABLET | Freq: Every day | ORAL | 1 refills | Status: DC
  Filled 2022-03-19: qty 30, 30d supply, fill #0

## 2022-03-19 MED ORDER — ALBUTEROL SULFATE HFA 108 (90 BASE) MCG/ACT IN AERS
2.0000 | INHALATION_SPRAY | RESPIRATORY_TRACT | 1 refills | Status: DC | PRN
  Filled 2022-03-19: qty 18, fill #0

## 2022-03-19 MED ORDER — TIZANIDINE HCL 4 MG PO TABS
4.0000 mg | ORAL_TABLET | Freq: Three times a day (TID) | ORAL | 1 refills | Status: DC | PRN
  Filled 2022-03-19: qty 60, 20d supply, fill #0

## 2022-03-19 MED ORDER — TERBINAFINE HCL 250 MG PO TABS
250.0000 mg | ORAL_TABLET | Freq: Every day | ORAL | 2 refills | Status: DC
  Filled 2022-03-19: qty 30, 30d supply, fill #0

## 2022-03-19 MED ORDER — PREDNISONE 20 MG PO TABS
20.0000 mg | ORAL_TABLET | Freq: Every day | ORAL | 0 refills | Status: DC
  Filled 2022-03-19: qty 5, 5d supply, fill #0

## 2022-03-19 MED ORDER — ALBUTEROL SULFATE HFA 108 (90 BASE) MCG/ACT IN AERS
2.0000 | INHALATION_SPRAY | Freq: Four times a day (QID) | RESPIRATORY_TRACT | 3 refills | Status: DC | PRN
  Filled 2022-03-19 – 2022-07-16 (×3): qty 6.7, 25d supply, fill #0

## 2022-03-19 NOTE — Patient Instructions (Signed)
Postnasal Drip Postnasal drip is the feeling of mucus going down the back of your throat. Mucus is a slimy substance that moistens and cleans your nose and throat, as well as the air pockets in face bones near your forehead and cheeks (sinuses). Small amounts of mucus pass from your nose and sinuses down the back of your throat all the time. This is normal. When you produce too much mucus or the mucus gets too thick, you can feel it. Some common causes of postnasal drip include: Having more mucus because of: A cold or the flu. Allergies. Cold air. Certain medicines. Gastroesophageal reflux. Having more mucus that is thicker because of: A sinus or nasal infection. Dry air. A food allergy. Follow these instructions at home: Relieving discomfort  Gargle with a mixture of salt and water 3-4 times a day or as needed. To make salt water, completely dissolve -1 tsp (3-6 g) of salt in 1 cup (237 mL) of warm water. If the air in your home is dry, use a humidifier to add moisture to the air. Use a saline spray or a container (neti pot) to flush out the nose (nasal irrigation). These methods can help clear away mucus and keep the nasal passages moist. General instructions Take over-the-counter and prescription medicines only as told by your health care provider. Follow instructions from your health care provider about eating or drinking restrictions. You may need to avoid caffeine. Avoid things that you know you are allergic to (allergens), like dust, mold, pollen, pets, or certain foods. Drink enough fluid to keep your urine pale yellow. Keep all follow-up visits. This is important. Contact a health care provider if: You have a fever. You have a sore throat or difficulty swallowing. You have a headache. You have sinus or ear pain. You have a cough that does not go away. The mucus from your nose becomes thick and is green or yellow in color. You have cold or flu symptoms that last more than 10  days. Summary Postnasal drip is the feeling of mucus going down the back of your throat. Use nasal irrigation or a nasal spray to help clear away mucus and keep the nasal passages moist. Avoid things that you know you are allergic to (allergens), like dust, mold, pollen, pets, or certain foods. This information is not intended to replace advice given to you by your health care provider. Make sure you discuss any questions you have with your health care provider. Document Revised: 01/01/2021 Document Reviewed: 01/01/2021 Elsevier Patient Education  2023 [employer]  

## 2022-03-19 NOTE — Progress Notes (Signed)
Subjective:  Patient ID: Darren Burns, male    DOB: [birth year]  Age: 47 y.o. MRN: [id number]  CC: Cough   HPI Darren Burns is a 47 y.o. year old male seen for an acute visit.  Interval History:  For the last 3-4 weeks he has been coughing and this has been associated with postnasal drip, rhinorrhea.  He also has pressure behind his eyes. 6 weeks ago he tested positive for COVID but at the time was asymptomatic then he subsequently developed fatigue, cough, rhinorrhea. 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  Also complains of right-sided pain.  This occurred in the past and he received a muscle relaxant and symptoms resolved but yesterday symptoms started again. He was treated with terbinafine in the past for onychomycosis with improvement in toes of the right foot and would like this again for his toes on the left foot. Past Medical History:  Diagnosis Date   Epididymal mass    left   Frequency of urination    GERD (gastroesophageal reflux disease)    History of exercise intolerance 10/2017   negative ETT, no ischemia   History of Helicobacter pylori infection 07/2017    Past Surgical History:  Procedure Laterality Date   CYSTOSCOPY N/A 11/24/2017   Procedure: FLEX CYSTOSCOPY;  Surgeon: Irine Seal, MD;  Location: [facility];  Service: Urology;  Laterality: N/A;   SCROTAL EXPLORATION Left 11/24/2017   Procedure: LEFT INGUINAL EXPLORATION WITH EXCISION OF EPIDYIMAL MASS;  Surgeon: Irine Seal, MD;  Location: [facility];  Service: Urology;  Laterality: Left;    No family history on file.  Social History   Socioeconomic History   Marital status: Married    Spouse name: Not on file   Number of children: Not on file   Years of education: Not on file   Highest education level: Not on file  Occupational History   Not on file  Tobacco Use   Smoking status: Former    Years: 10.00    Types: Cigarettes     Quit date: 02/15/1998    Years since quitting: 24.1   Smokeless tobacco: Never  Vaping Use   Vaping Use: Never used  Substance and Sexual Activity   Alcohol use: No   Drug use: Not Currently    Types: Cocaine, "Crack" cocaine, Marijuana    Comment: 11-22-2017 per pt last cocaine 2001   Sexual activity: Yes  Other Topics Concern   Not on file  Social History Narrative   Not on file   Social Determinants of Health   Financial Resource Strain: Not on file  Food Insecurity: Not on file  Transportation Needs: Not on file  Physical Activity: Not on file  Stress: Not on file  Social Connections: Not on file    Allergies  Allergen Reactions   Ibuprofen Swelling and Other (See Comments)    Lip swelling   Tamiflu [Oseltamivir Phosphate] Nausea And Vomiting    Outpatient Medications Prior to Visit  Medication Sig Dispense Refill   hydrOXYzine (ATARAX/VISTARIL) 25 MG tablet Take 1 tablet (25 mg total) by mouth at bedtime as needed. 30 tablet 2   omeprazole (PRILOSEC) 20 MG capsule Take 1 capsule (20 mg total) by mouth daily. 30 capsule 3   polyethylene glycol powder (GLYCOLAX/MIRALAX) powder Take 17 g by mouth daily. 3350 g 1   acetaminophen (TYLENOL) 500 MG tablet Take 1,000 mg by mouth every 6 (six) hours as  needed for moderate pain or headache. (Patient not taking: Reported on 03/19/2022)     albuterol (VENTOLIN HFA) 108 (90 Base) MCG/ACT inhaler Inhale 2 puffs into the lungs every 4 (four) hours as needed for wheezing or shortness of breath. (Patient not taking: Reported on 03/19/2022) 18 g 1   terbinafine (LAMISIL) 250 MG tablet Take 1 tablet (250 mg total) by mouth daily. (Patient not taking: Reported on 03/19/2022) 30 tablet 2   No facility-administered medications prior to visit.     ROS Review of Systems  Constitutional:  Negative for activity change and appetite change.  HENT:  Positive for rhinorrhea. Negative for sinus pressure and sore throat.   Respiratory:  Positive for  cough. Negative for chest tightness, shortness of breath and wheezing.   Cardiovascular:  Negative for chest pain and palpitations.  Gastrointestinal:  Negative for abdominal distention, abdominal pain and constipation.  Genitourinary: Negative.   Musculoskeletal: Negative.   Psychiatric/Behavioral:  Negative for behavioral problems and dysphoric mood.     Objective:  BP (!) 135/97 (BP Location: Right Arm)   Pulse 82   Temp 98.3 F (36.8 C)   Wt 204 lb 12.8 oz (92.9 kg)   SpO2 99%   BMI 34.08 kg/m      03/19/2022    8:46 AM 12/07/2021   10:10 AM 12/09/2020   11:22 AM  BP/Weight  Systolic BP 409 811 914  Diastolic BP 97 782 96  Wt. (Lbs) 204.8 202 204  BMI 34.08 kg/m2 33.61 kg/m2 33.95 kg/m2      Physical Exam Constitutional:      Appearance: He is well-developed.  HENT:     Right Ear: Tympanic membrane normal.     Left Ear: Tympanic membrane normal.     Mouth/Throat:     Mouth: Mucous membranes are moist.  Cardiovascular:     Rate and Rhythm: Normal rate.     Heart sounds: Normal heart sounds. No murmur heard. Pulmonary:     Effort: Pulmonary effort is normal.     Breath sounds: Normal breath sounds. No wheezing or rales.  Chest:     Chest wall: No tenderness.  Abdominal:     General: Bowel sounds are normal. There is no distension.     Palpations: Abdomen is soft. There is no mass.     Tenderness: There is no abdominal tenderness.  Musculoskeletal:     Right lower leg: No edema.     Left lower leg: No edema.     Comments: Slight tenderness to palpation of right lower rib cage  Neurological:     Mental Status: He is alert and oriented to person, place, and time.  Psychiatric:        Mood and Affect: Mood normal.        Latest Ref Rng & Units 12/09/2020   12:21 PM 04/26/2019    5:05 PM 01/02/2018    4:58 PM  CMP  Glucose 70 - 99 mg/dL 92  91  80   BUN 6 - 24 mg/dL '9  16  15   '$ Creatinine 0.76 - 1.27 mg/dL 0.84  0.77  0.92   Sodium 134 - 144 mmol/L  140  140  139   Potassium 3.5 - 5.2 mmol/L 4.5  4.3  4.3   Chloride 96 - 106 mmol/L 101  103  100   CO2 20 - 29 mmol/L '23  24  21   '$ Calcium 8.7 - 10.2 mg/dL 9.4  9.6  9.7  Total Protein 6.0 - 8.5 g/dL  6.9  7.4   Total Bilirubin 0.0 - 1.2 mg/dL  0.2  0.2   Alkaline Phos 39 - 117 IU/L  100  102   AST 0 - 40 IU/L  24  26   ALT 0 - 44 IU/L  29  37     Lipid Panel     Component Value Date/Time   CHOL 179 04/26/2019 1705   TRIG 193 (H) 04/26/2019 1705   HDL 41 04/26/2019 1705   CHOLHDL 4.4 04/26/2019 1705   CHOLHDL 4.4 12/22/2015 0905   VLDL 28 12/22/2015 0905   LDLCALC 104 (H) 04/26/2019 1705    CBC    Component Value Date/Time   WBC 5.6 12/09/2020 1221   WBC 6.2 11/22/2017 1413   RBC 5.47 12/09/2020 1221   RBC 5.28 11/22/2017 1413   HGB 16.5 12/09/2020 1221   HCT 48.2 12/09/2020 1221   PLT 276 12/09/2020 1221   MCV 88 12/09/2020 1221   MCH 30.2 12/09/2020 1221   MCH 29.5 11/22/2017 1413   MCHC 34.2 12/09/2020 1221   MCHC 34.4 11/22/2017 1413   RDW 13.3 12/09/2020 1221   LYMPHSABS 1.7 12/09/2020 1221   MONOABS 0.5 05/27/2014 2008   EOSABS 0.1 12/09/2020 1221   BASOSABS 0.0 12/09/2020 1221    Lab Results  Component Value Date   HGBA1C 5.4 04/26/2019    Assessment & Plan:  1. Onychomycosis - terbinafine (LAMISIL) 250 MG tablet; Take 1 tablet (250 mg total) by mouth daily.  Dispense: 30 tablet; Refill: 2  2. Postnasal drip Could explain his ongoing cough - predniSONE (DELTASONE) 20 MG tablet; Take 1 tablet (20 mg total) by mouth daily with breakfast.  Dispense: 5 tablet; Refill: 0 - cetirizine (ZYRTEC) 10 MG tablet; Take 1 tablet (10 mg total) by mouth daily.  Dispense: 30 tablet; Refill: 1  3. 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  Dispense: 60 tablet; Refill: 1  4. Screening for  metabolic disorder - LP+Non-HDL Cholesterol - CMP14+EGFR - CBC with Differential/Platelet  5. Screening for diabetes mellitus - Hemoglobin A1c  6. Screening for viral disease - HCV Ab w Reflex to Quant PCR - HIV Antibody (routine testing w rflx)  7. Screening for colon cancer - Fecal occult blood, imunochemical([facility])  8. Need for influenza vaccination - Flu Vaccine QUAD 52moIM (Fluarix, Fluzone & Alfiuria Quad PF)    Meds ordered this encounter  Medications   predniSONE (DELTASONE) 20 MG tablet    Sig: Take 1 tablet (20 mg total) by mouth daily with breakfast.    Dispense:  5 tablet    Refill:  0   DISCONTD: albuterol (VENTOLIN HFA) 108 (90 Base) MCG/ACT inhaler    Sig: Inhale 2 puffs into the lungs every 4 (four) hours as needed for wheezing or shortness of breath.    Dispense:  18 g    Refill:  1   tiZANidine (ZANAFLEX) 4 MG tablet    Sig: Take 1 tablet (4 mg total) by mouth every 8 (eight) hours as needed.    Dispense:  60 tablet    Refill:  1   terbinafine (LAMISIL) 250 MG tablet    Sig: Take 1 tablet (250 mg total) by mouth daily.    Dispense:  30 tablet  Refill:  2   cetirizine (ZYRTEC) 10 MG tablet    Sig: Take 1 tablet (10 mg total) by mouth daily.    Dispense:  30 tablet    Refill:  1    Follow-up: Return if symptoms worsen or fail to improve.       Charlott Rakes, MD, FAAFP. [facility] and [place], [place]   03/19/2022, 10:02 AM

## 2022-03-20 LAB — LP+NON-HDL CHOLESTEROL
Cholesterol, Total: 162 mg/dL (ref 100–199)
HDL: 38 mg/dL — ABNORMAL LOW (ref >39)
LDL Chol Calc (NIH): 100 mg/dL — ABNORMAL HIGH (ref 0–99)
Total Non-HDL-Chol (LDL+VLDL): 124 mg/dL (ref 0–129)
Triglycerides: 137 mg/dL (ref 0–149)
VLDL Cholesterol Cal: 24 mg/dL (ref 5–40)

## 2022-03-20 LAB — CBC WITH DIFFERENTIAL/PLATELET
Basophils Absolute: 0 10*3/uL (ref 0.0–0.2)
Basos: 1 %
EOS (ABSOLUTE): 0.3 10*3/uL (ref 0.0–0.4)
Eos: 5 %
Hematocrit: 47.4 % (ref 37.5–51.0)
Hemoglobin: 16.4 g/dL (ref 13.0–17.7)
Immature Grans (Abs): 0 10*3/uL (ref 0.0–0.1)
Immature Granulocytes: 0 %
Lymphocytes Absolute: 1.2 10*3/uL (ref 0.7–3.1)
Lymphs: 19 %
MCH: 30.5 pg (ref 26.6–33.0)
MCHC: 34.6 g/dL (ref 31.5–35.7)
MCV: 88 fL (ref 79–97)
Monocytes Absolute: 0.7 10*3/uL (ref 0.1–0.9)
Monocytes: 11 %
Neutrophils Absolute: 4.2 10*3/uL (ref 1.4–7.0)
Neutrophils: 64 %
Platelets: 237 10*3/uL (ref 150–450)
RBC: 5.38 x10E6/uL (ref 4.14–5.80)
RDW: 13.2 % (ref 11.6–15.4)
WBC: 6.6 10*3/uL (ref 3.4–10.8)

## 2022-03-20 LAB — HCV INTERPRETATION

## 2022-03-20 LAB — HEMOGLOBIN A1C
Est. average glucose Bld gHb Est-mCnc: 117 mg/dL
Hgb A1c MFr Bld: 5.7 % — ABNORMAL HIGH (ref 4.8–5.6)

## 2022-03-20 LAB — CMP14+EGFR
ALT: 29 IU/L (ref 0–44)
AST: 21 IU/L (ref 0–40)
Albumin/Globulin Ratio: 1.7 (ref 1.2–2.2)
Albumin: 4.4 g/dL (ref 4.1–5.1)
Alkaline Phosphatase: 98 IU/L (ref 44–121)
BUN/Creatinine Ratio: 12 (ref 9–20)
BUN: 11 mg/dL (ref 6–24)
Bilirubin Total: 0.4 mg/dL (ref 0.0–1.2)
CO2: 23 mmol/L (ref 20–29)
Calcium: 9.2 mg/dL (ref 8.7–10.2)
Chloride: 103 mmol/L (ref 96–106)
Creatinine, Ser: 0.9 mg/dL (ref 0.76–1.27)
Globulin, Total: 2.6 g/dL (ref 1.5–4.5)
Glucose: 96 mg/dL (ref 70–99)
Potassium: 4.3 mmol/L (ref 3.5–5.2)
Sodium: 140 mmol/L (ref 134–144)
Total Protein: 7 g/dL (ref 6.0–8.5)
eGFR: 105 mL/min/{1.73_m2} (ref >59)

## 2022-03-20 LAB — HIV ANTIBODY (ROUTINE TESTING W REFLEX): HIV Screen 4th Generation wRfx: NONREACTIVE

## 2022-03-20 LAB — HCV AB W REFLEX TO QUANT PCR: HCV Ab: NONREACTIVE

## 2022-03-24 ENCOUNTER — Ambulatory Visit: Payer: Self-pay | Admitting: Critical Care Medicine

## 2022-03-24 NOTE — Progress Notes (Deleted)
   Established Patient Office Visit  Subjective   Patient ID: Darren Burns, male    DOB: 10-08-1975  Age: 47 y.o. MRN: [id number]  No chief complaint on file.   47 y.o.M pcp Darren Burns here for cough  Saw Darren Burns 03/19/22 For the last 3-4 weeks he has been coughing and this has been associated with postnasal drip, rhinorrhea.  He also has pressure behind his eyes. 6 weeks ago he tested positive for COVID but at the time was asymptomatic then he subsequently developed fatigue, cough, rhinorrhea. 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   Also complains of right-sided pain.  This occurred in the past and he received a muscle relaxant and symptoms resolved but yesterday symptoms started again. He was treated with terbinafine in the past for onychomycosis with improvement in toes of the right foot and would like this again for his toes on the left foot. Rx pred/antihistamine ? Post nasal drip?     {History (Optional):23778}  ROS    Objective:     There were no vitals taken for this visit. {Vitals History (Optional):23777}  Physical Exam   No results found for any visits on 03/24/22.  {Labs (Optional):23779}  The 10-year ASCVD risk score (Arnett DK, et al., 2019) is: 3.1%    Assessment & Plan:   Problem List Items Addressed This Visit   None   No follow-ups on file.    Darren Noble, MD

## 2022-03-25 ENCOUNTER — Other Ambulatory Visit: Payer: Self-pay

## 2022-05-12 ENCOUNTER — Encounter (HOSPITAL_COMMUNITY): Payer: Self-pay

## 2022-05-12 ENCOUNTER — Ambulatory Visit (HOSPITAL_COMMUNITY)
Admission: EM | Admit: 2022-05-12 | Discharge: 2022-05-12 | Disposition: A | Discharge status: Home / Self Care | Payer: Self-pay | Attending: Emergency Medicine | Admitting: Emergency Medicine

## 2022-05-12 ENCOUNTER — Emergency Department (HOSPITAL_COMMUNITY)
Admission: EM | Admit: 2022-05-12 | Discharge: 2022-05-12 | Disposition: A | Discharge status: Home / Self Care | Payer: Self-pay | Attending: Emergency Medicine | Admitting: Emergency Medicine

## 2022-05-12 ENCOUNTER — Other Ambulatory Visit: Payer: Self-pay

## 2022-05-12 ENCOUNTER — Ambulatory Visit: Payer: Self-pay | Admitting: *Deleted

## 2022-05-12 ENCOUNTER — Emergency Department (HOSPITAL_COMMUNITY): Payer: Self-pay

## 2022-05-12 DIAGNOSIS — R079 Chest pain, unspecified: Secondary | ICD-10-CM | POA: Insufficient documentation

## 2022-05-12 DIAGNOSIS — R0602 Shortness of breath: Secondary | ICD-10-CM

## 2022-05-12 DIAGNOSIS — I10 Essential (primary) hypertension: Secondary | ICD-10-CM | POA: Insufficient documentation

## 2022-05-12 DIAGNOSIS — L03116 Cellulitis of left lower limb: Secondary | ICD-10-CM | POA: Insufficient documentation

## 2022-05-12 DIAGNOSIS — I2510 Atherosclerotic heart disease of native coronary artery without angina pectoris: Secondary | ICD-10-CM | POA: Insufficient documentation

## 2022-05-12 LAB — CBC
HCT: 45.8 % (ref 39.0–52.0)
Hemoglobin: 15.7 g/dL (ref 13.0–17.0)
MCH: 30.2 pg (ref 26.0–34.0)
MCHC: 34.3 g/dL (ref 30.0–36.0)
MCV: 88.1 fL (ref 80.0–100.0)
Platelets: 273 10*3/uL (ref 150–400)
RBC: 5.2 MIL/uL (ref 4.22–5.81)
RDW: 13.3 % (ref 11.5–15.5)
WBC: 6.8 10*3/uL (ref 4.0–10.5)
nRBC: 0 % (ref 0.0–0.2)

## 2022-05-12 LAB — BASIC METABOLIC PANEL
Anion gap: 11 (ref 5–15)
BUN: 8 mg/dL (ref 6–20)
CO2: 23 mmol/L (ref 22–32)
Calcium: 9.1 mg/dL (ref 8.9–10.3)
Chloride: 104 mmol/L (ref 98–111)
Creatinine, Ser: 0.95 mg/dL (ref 0.61–1.24)
GFR, Estimated: >60 mL/min (ref >60)
Glucose, Bld: 100 mg/dL — ABNORMAL HIGH (ref 70–99)
Potassium: 4.4 mmol/L (ref 3.5–5.1)
Sodium: 138 mmol/L (ref 135–145)

## 2022-05-12 LAB — TROPONIN I (HIGH SENSITIVITY)
Troponin I (High Sensitivity): 3 ng/L (ref <18)
Troponin I (High Sensitivity): 3 ng/L (ref <18)

## 2022-05-12 MED ORDER — DOXYCYCLINE HYCLATE 100 MG PO CAPS: 100.0000 mg | ORAL_CAPSULE | Freq: Two times a day (BID) | ORAL | 0 refills | Status: DC

## 2022-05-12 NOTE — Telephone Encounter (Signed)
Reason for Disposition  Pain also in shoulder(s) or arm(s) or jaw  (Exception: Pain is clearly made worse by movement.)  Answer Assessment - Initial Assessment Questions 1. LOCATION: "Where does it hurt?"       My 155/103 is high BP for me.   I feel weird 3 days ago.   2 nights ago I found a swollen red place on my leg.    I'm chilly and my BP is high.    I have a tight/pressure in my chest.   In the middle of my chest.   My breathing is not hard but I get tired quick.   I have to stop and get my breath even while talking with you. 2. RADIATION: "Does the pain go anywhere else?" (e.g., into neck, jaw, arms, back)     It's uncomfortable, weird in my left arm.     My left arm is cold.      I referred him on to the ED at this point. 3. ONSET: "When did the chest pain begin?" (Minutes, hours or days)      Feeling weird for 3 days now. 4. PATTERN: "Does the pain come and go, or has it been constant since it started?"  "Does it get worse with exertion?"      Not asked sent to the ED 5. DURATION: "How long does it last" (e.g., seconds, minutes, hours)      6. SEVERITY: "How bad is the pain?"  (e.g., Scale 1-10; mild, moderate, or severe)    - MILD (1-3): doesn't interfere with normal activities     - MODERATE (4-7): interferes with normal activities or awakens from sleep    - SEVERE (8-10): excruciating pain, unable to do any normal activities        7. CARDIAC RISK FACTORS: "Do you have any history of heart problems or risk factors for heart disease?" (e.g., angina, prior heart attack; diabetes, high blood pressure, high cholesterol, smoker, or strong family history of heart disease)     No 8. PULMONARY RISK FACTORS: "Do you have any history of lung disease?"  (e.g., blood clots in lung, asthma, emphysema, birth control pills)      9. CAUSE: "What do you think is causing the chest pain?"      10. OTHER SYMPTOMS: "Do you have any other symptoms?" (e.g., dizziness, nausea, vomiting, sweating,  fever, difficulty breathing, cough)       Chest pressure/tightness in middle of my chest and my left arm feels different and it's colder than my right arm. 11. PREGNANCY: "Is there any chance you are pregnant?" "When was your last menstrual period?"       N/A  Protocols used: Chest Pain-A-AH

## 2022-05-12 NOTE — ED Notes (Signed)
Patient is being discharged from the Urgent Care and sent to the Emergency Department via car . Per Dr. Mannie Stabile, patient is in need of higher level of care due to CHEST PAIN. Patient is aware and verbalizes understanding of plan of care.  Vitals:   05/12/22 1136  BP: (!) 139/97  Pulse: 82  Resp: 12  Temp: 98 F (36.7 C)  SpO2: 100%

## 2022-05-12 NOTE — Telephone Encounter (Signed)
  Chief Complaint: chest tightness/pressure in center of his chest with radiation down his left arm.   Left arm also feels cool.   Having shortness of breath and "feeling weird" Symptoms: above Frequency: Now Pertinent Negatives: Patient denies cardiac history Disposition: [x] ED /[] Urgent Care (no appt availability in office) / [] Appointment(In office/virtual)/ []  [facility]/ [] Home Care/ [] Refused Recommended Disposition /[] [facility]/ []  Follow-up with PCP Additional Notes: Pt agreeable to going to the ED now.    Someone is there with him now that can take him now. Message sent to Dr. Margarita Rana for her information.

## 2022-05-12 NOTE — ED Triage Notes (Signed)
Pt is here for chest pain with left shoulder pain and numbness to left arm,  since this morning pt also has a ingrown hair follicle in the groin area causing som pain , redness x 2days . Pt took tylenol yesterday .

## 2022-05-12 NOTE — ED Provider Notes (Signed)
[facility] Provider Note   CSN: [id number] Arrival date & time: 05/12/22  1220     History  Chief Complaint  Patient presents with   Chest Pain   SOB   Insect Bite    Darren Burns is a 47 y.o. male  This is an overall well-appearing 47 year old male with past medical history significant for acid reflux, previous chest pain without known CAD, hypertension, no history of hyperlipidemia, no history of diabetes, tobacco abuse, previous CAD, ACS, stroke who presents with some left-sided chest tightness versus pressure that began this morning, with some radiation to the left arm.  Patient reports it lasted for about 5 hours and then resolved.  He denies any nausea, vomiting.  Patient also endorses a red area near his left groin that he is concerned he may have been bitten by spider or some other kind of insect.  He reports that there are some surrounding redness.  He reports he is chest pain-free at time my evaluation.  Chest Pain      Home Medications Prior to Admission medications   Medication Sig Start Date End Date Taking? Authorizing Provider  doxycycline (VIBRAMYCIN) 100 MG capsule Take 1 capsule (100 mg total) by mouth 2 (two) times daily. 05/12/22  Yes Kupono Marling H, PA-C  acetaminophen (TYLENOL) 500 MG tablet Take 1,000 mg by mouth every 6 (six) hours as needed for moderate pain or headache.    [provider]  albuterol (PROVENTIL HFA) 108 (90 Base) MCG/ACT inhaler Inhale 2 puffs into the lungs every 6 (six) hours as needed for wheezing or shortness of breath. 03/19/22   Charlott Rakes, MD  cetirizine (ZYRTEC) 10 MG tablet Take 1 tablet (10 mg total) by mouth daily. 03/19/22   Charlott Rakes, MD  hydrOXYzine (ATARAX/VISTARIL) 25 MG tablet Take 1 tablet (25 mg total) by mouth at bedtime as needed. 12/09/20   Charlott Rakes, MD  omeprazole (PRILOSEC) 20 MG capsule Take 1 capsule (20 mg total) by mouth daily.  08/15/19   Argentina Donovan, PA-C  polyethylene glycol powder (GLYCOLAX/MIRALAX) powder Take 17 g by mouth daily. 11/26/16   Charlott Rakes, MD  predniSONE (DELTASONE) 20 MG tablet Take 1 tablet (20 mg total) by mouth daily with breakfast. 03/19/22   Charlott Rakes, MD  terbinafine (LAMISIL) 250 MG tablet Take 1 tablet (250 mg total) by mouth daily. 03/19/22   Charlott Rakes, MD  tiZANidine (ZANAFLEX) 4 MG tablet Take 1 tablet (4 mg total) by mouth every 8 (eight) hours as needed. 03/19/22   Charlott Rakes, MD      Allergies    Ibuprofen and Tamiflu [oseltamivir phosphate]    Review of Systems   Review of Systems  Cardiovascular:  Positive for chest pain.  All other systems reviewed and are negative.   Physical Exam Updated Vital Signs BP (!) 137/97   Pulse 78   Temp 98.2 F (36.8 C) (Oral)   Resp 16   Ht 5\' 5"  (1.651 m)   Wt 93 kg   SpO2 100%   BMI 34.11 kg/m  Physical Exam Vitals and nursing note reviewed.  Constitutional:      General: He is not in acute distress.    Appearance: Normal appearance.  HENT:     Head: Normocephalic and atraumatic.  Eyes:     General:        Right eye: No discharge.        Left eye: No discharge.  Cardiovascular:  Rate and Rhythm: Normal rate and regular rhythm.     Heart sounds: No murmur heard.    No friction rub. No gallop.  Pulmonary:     Effort: Pulmonary effort is normal.     Breath sounds: Normal breath sounds.     Comments: No accessory breath sounds, wheezing, rhonchi, rales Chest:     Comments: No significant TTP of chest wall Abdominal:     General: Bowel sounds are normal.     Palpations: Abdomen is soft.  Skin:    General: Skin is warm and dry.     Capillary Refill: Capillary refill takes less than 2 seconds.     Comments: Patient with a tender red approximately 1 mm lump on the left inner thigh consistent with bug bite versus ingrown hair, there are some surrounding tracking redness, fluctuance consistent with  cellulitis.  There is no large area of fluid palpated that would respond to drainage on my exam.  Neurological:     Mental Status: He is alert and oriented to person, place, and time.  Psychiatric:        Mood and Affect: Mood normal.        Behavior: Behavior normal.     ED Results / Procedures / Treatments   Labs (all labs ordered are listed, but only abnormal results are displayed) Labs Reviewed  BASIC METABOLIC PANEL - Abnormal; Notable for the following components:      Result Value   Glucose, Bld 100 (*)    All other components within normal limits  CBC  TROPONIN I (HIGH SENSITIVITY)  TROPONIN I (HIGH SENSITIVITY)    EKG None  Radiology DG Chest 2 View  Result Date: 05/12/2022 CLINICAL DATA:  cp EXAM: CHEST - 2 VIEW COMPARISON:  11/09/2014 FINDINGS: Lungs are clear. Heart size and mediastinal contours are within normal limits. No effusion.  No pneumothorax. Visualized bones unremarkable. IMPRESSION: No acute cardiopulmonary disease. Electronically Signed   By: Lucrezia Europe M.D.   On: 05/12/2022 12:59    Procedures Procedures    Medications Ordered in ED Medications - No data to display  ED Course/ Medical Decision Making/ A&P                             Medical Decision Making Amount and/or Complexity of Data Reviewed Labs: ordered. Radiology: ordered.    This patient is a 47 y.o. male  who presents to the ED for concern of chest pain, wound on left thigh.   Differential diagnoses prior to evaluation: The emergent differential diagnosis includes, but is not limited to,  ACS, AAS, PE, Mallory-Weiss, Boerhaave's, Pneumonia, acute bronchitis, asthma or COPD exacerbation, anxiety, MSK pain or traumatic injury to the chest, acid reflux versus other, considered cellulitis, abscess, bug bite, versus other for the wound on the left thigh. This is not an exhaustive differential.   Past Medical History / Co-morbidities: Hypertension, GERD  Additional history: Chart  reviewed. Pertinent results include: Reviewed echo, exercise tolerance test for the last 5 years, patient with left ventricular ejection fraction 60-65%, no significant abnormalities on stress test.  Physical Exam: Physical exam performed. The pertinent findings include:  Patient with a tender red approximately 1 mm lump on the left inner thigh consistent with bug bite versus ingrown hair, there are some surrounding tracking redness, fluctuance consistent with cellulitis.  There is no large area of fluid palpated that would respond to drainage on my exam.  No ttp of chest wall  Some hypertension on arrival, blood pressure 137/100.  Lab Tests/Imaging studies: I personally interpreted labs/imaging and the pertinent results include: CBC, BMP unremarkable, troponin negative x 2, flat, plain from chest x-ray without any acute intrathoracic abnormality. I agree with the radiologist interpretation.  Cardiac monitoring: EKG obtained and interpreted by my attending physician which shows: Normal sinus rhythm   Medications: Will discharge on 10 days of doxycycline to cover for any tickborne illness as well as the cellulitis noted on exam   Disposition: After consideration of the diagnostic results and the patients response to treatment, I feel that patient is stable for discharge at this time, no evidence of ACS, his heart score is 1, left upper thigh wound is consistent with cellulitis. Encourage PCP follow up.  emergency department workup does not suggest an emergent condition requiring admission or immediate intervention beyond what has been performed at this time. The plan is: as above. The patient is safe for discharge and has been instructed to return immediately for worsening symptoms, change in symptoms or any other concerns.  Final Clinical Impression(s) / ED Diagnoses Final diagnoses:  Chest pain, unspecified type  Cellulitis of left thigh    Rx / DC Orders ED Discharge Orders           Ordered    doxycycline (VIBRAMYCIN) 100 MG capsule  2 times daily        05/12/22 1612              Saralyn Willison, [place], PA-C 05/12/22 1618    Fransico Meadow, MD 05/13/22 1245

## 2022-05-12 NOTE — Discharge Instructions (Addendum)
You may want to try some over-the-counter Maalox, Mylanta, Tums in case the chest pain that you are experiencing was more from acid reflux presentation.  The antibiotics are prescribing to cover for the cellulitis, or any possible complications of unclear bug bite.  I recommend that you follow-up with your primary care doctor for recheck of the lesion on the left thigh.

## 2022-05-12 NOTE — ED Triage Notes (Signed)
Pt came in via POV d/t chest tightness in the center of his chest & this morning his Lt arm began feeling "numb." Only rates general discomfort a 2/10 while in triage, Denies cardiac Hx but does have Hx of reflux. Pt also report he has a red area near his Lt groin that he is concerned may have been bitten by a spider or some kind of insect.

## 2022-05-12 NOTE — Telephone Encounter (Signed)
Patient checked in to ED at 12:20 pm

## 2022-07-13 ENCOUNTER — Ambulatory Visit: Payer: Self-pay

## 2022-07-13 NOTE — Telephone Encounter (Signed)
    Chief Complaint: Left side back pain Symptoms: Above Frequency: Today Pertinent Negatives: Patient denies weakness Disposition: [] ED /[] Urgent Care (no appt availability in office) / [x] Appointment(In office/virtual)/ []  [facility]/ [] Home Care/ [] Refused Recommended Disposition /[] [facility]/ []  Follow-up with PCP Additional Notes: Call back for worsening of symptoms.   Reason for Disposition  [1] MODERATE back pain (e.g., interferes with normal activities) AND [2] present > 3 days  Answer Assessment - Initial Assessment Questions 1. ONSET: "When did the pain begin?"      Today 2. LOCATION: "Where does it hurt?" (upper, mid or lower back)     Left flank 3. SEVERITY: "How bad is the pain?"  (e.g., Scale 1-10; mild, moderate, or severe)   - MILD (1-3): Doesn't interfere with normal activities.    - MODERATE (4-7): Interferes with normal activities or awakens from sleep.    - SEVERE (8-10): Excruciating pain, unable to do any normal activities.      5 4. PATTERN: "Is the pain constant?" (e.g., yes, no; constant, intermittent)      Comes and goes 5. RADIATION: "Does the pain shoot into your legs or somewhere else?"     No 6. CAUSE:  "What do you think is causing the back pain?"      Unsure 7. BACK OVERUSE:  "Any recent lifting of heavy objects, strenuous work or exercise?"     No 8. MEDICINES: "What have you taken so far for the pain?" (e.g., nothing, acetaminophen, NSAIDS)     None 9. NEUROLOGIC SYMPTOMS: "Do you have any weakness, numbness, or problems with bowel/bladder control?"     No 10. OTHER SYMPTOMS: "Do you have any other symptoms?" (e.g., fever, abdomen pain, burning with urination, blood in urine)       Right foot pain 11. PREGNANCY: "Is there any chance you are pregnant?" "When was your last menstrual period?"       N/a  Protocols used: Back Pain-A-AH

## 2022-07-16 ENCOUNTER — Encounter: Payer: Self-pay | Admitting: Internal Medicine

## 2022-07-16 ENCOUNTER — Other Ambulatory Visit: Payer: Self-pay | Admitting: Physician Assistant

## 2022-07-16 ENCOUNTER — Ambulatory Visit: Payer: Self-pay | Attending: Internal Medicine | Admitting: Internal Medicine

## 2022-07-16 ENCOUNTER — Other Ambulatory Visit: Payer: Self-pay

## 2022-07-16 VITALS — BP 140/95 | HR 80 | Ht 65.0 in | Wt 205.0 lb

## 2022-07-16 DIAGNOSIS — R030 Elevated blood-pressure reading, without diagnosis of hypertension: Secondary | ICD-10-CM

## 2022-07-16 DIAGNOSIS — M722 Plantar fascial fibromatosis: Secondary | ICD-10-CM

## 2022-07-16 DIAGNOSIS — R631 Polydipsia: Secondary | ICD-10-CM

## 2022-07-16 DIAGNOSIS — K219 Gastro-esophageal reflux disease without esophagitis: Secondary | ICD-10-CM

## 2022-07-16 DIAGNOSIS — R109 Unspecified abdominal pain: Secondary | ICD-10-CM

## 2022-07-16 LAB — POCT GLYCOSYLATED HEMOGLOBIN (HGB A1C): HbA1c, POC (prediabetic range): 5.5 % — AB (ref 5.7–6.4)

## 2022-07-16 LAB — POCT URINALYSIS DIP (CLINITEK)
Bilirubin, UA: NEGATIVE
Blood, UA: NEGATIVE
Glucose, UA: NEGATIVE mg/dL
Ketones, POC UA: NEGATIVE mg/dL
Leukocytes, UA: NEGATIVE
Nitrite, UA: NEGATIVE
POC PROTEIN,UA: NEGATIVE
Spec Grav, UA: 1.01 (ref 1.010–1.025)
Urobilinogen, UA: 0.2 E.U./dL
pH, UA: 7 (ref 5.0–8.0)

## 2022-07-16 LAB — GLUCOSE, POCT (MANUAL RESULT ENTRY): POC Glucose: 133 mg/dl — AB (ref 70–99)

## 2022-07-16 MED ORDER — OMEPRAZOLE 20 MG PO CPDR
20.0000 mg | DELAYED_RELEASE_CAPSULE | Freq: Every day | ORAL | 3 refills | Status: DC
  Filled 2022-07-16: qty 30, 30d supply, fill #0

## 2022-07-16 NOTE — Progress Notes (Signed)
Patient ID: Darren Burns, male    DOB: 09-08-1975  MRN: [id number]  CC: Back Pain   Subjective: Darren Burns is a 47 y.o. male who presents for UC visit.  PCP is Dr. Alvis Lemmings His concerns today include:    3 days ago, sharp pain LT lower flank When he bends and when in bed. Concern may be kidney; started drinking a lot of water about four 16 oz bottles a day.   Took muscle relaxer and Tylenol.  Pain Resolved No burning with urination.   Urinating a lot since he started drinking more water.  Gets up about once at nights.  More thirsty recently  BP elev Usually BP good at home per his report.  Once he started having pain in LT frank, BP starting increasing to 145-150/87-93.  I note that BP elev on past 2-4 visits in the health system were elevated Does not use a lot of salt  C/o intermittent sharp pain in plantar heal for past few mths.  Worse when he first gets out of bed in the mornings.  Gets better once he continues walking.  No recent injury to foot Patient Active Problem List   Diagnosis Date Noted   Right carpal tunnel syndrome 08/14/2019   Left carpal tunnel syndrome 08/14/2019   GERD (gastroesophageal reflux disease) 08/17/2016   Chest pain 11/10/2014   Pain in the chest    Dental cavities 10/25/2013   Annual physical exam 10/25/2013     Current Outpatient Medications on File Prior to Visit  Medication Sig Dispense Refill   acetaminophen (TYLENOL) 500 MG tablet Take 1,000 mg by mouth every 6 (six) hours as needed for moderate pain or headache.     cetirizine (ZYRTEC) 10 MG tablet Take 1 tablet (10 mg total) by mouth daily. 30 tablet 1   albuterol (PROVENTIL HFA) 108 (90 Base) MCG/ACT inhaler Inhale 2 puffs into the lungs every 6 (six) hours as needed for wheezing or shortness of breath. (Patient not taking: Reported on 07/16/2022) 6.7 g 3   doxycycline (VIBRAMYCIN) 100 MG capsule Take 1 capsule (100 mg total) by mouth 2 (two) times daily.  (Patient not taking: Reported on 07/16/2022) 20 capsule 0   hydrOXYzine (ATARAX/VISTARIL) 25 MG tablet Take 1 tablet (25 mg total) by mouth at bedtime as needed. (Patient not taking: Reported on 07/16/2022) 30 tablet 2   polyethylene glycol powder (GLYCOLAX/MIRALAX) powder Take 17 g by mouth daily. (Patient not taking: Reported on 07/16/2022) 3350 g 1   predniSONE (DELTASONE) 20 MG tablet Take 1 tablet (20 mg total) by mouth daily with breakfast. (Patient not taking: Reported on 07/16/2022) 5 tablet 0   terbinafine (LAMISIL) 250 MG tablet Take 1 tablet (250 mg total) by mouth daily. (Patient not taking: Reported on 07/16/2022) 30 tablet 2   tiZANidine (ZANAFLEX) 4 MG tablet Take 1 tablet (4 mg total) by mouth every 8 (eight) hours as needed. (Patient not taking: Reported on 07/16/2022) 60 tablet 1   No current facility-administered medications on file prior to visit.    Allergies  Allergen Reactions   Ibuprofen Swelling and Other (See Comments)    Lip swelling   Tamiflu [Oseltamivir Phosphate] Nausea And Vomiting    Social History   Socioeconomic History   Marital status: Married    Spouse name: Not on file   Number of children: Not on file   Years of education: Not on file   Highest education level: Not on file  Occupational History  Not on file  Tobacco Use   Smoking status: Former    Years: 10    Types: Cigarettes    Quit date: 02/15/1998    Years since quitting: 24.4   Smokeless tobacco: Never  Vaping Use   Vaping Use: Never used  Substance and Sexual Activity   Alcohol use: No   Drug use: Not Currently    Types: Cocaine, "Crack" cocaine, Marijuana    Comment: 11-22-2017 per pt last cocaine 2001   Sexual activity: Yes  Other Topics Concern   Not on file  Social History Narrative   Not on file   Social Determinants of Health   Financial Resource Strain: Not on file  Food Insecurity: Not on file  Transportation Needs: Not on file  Physical Activity: Not on file   Stress: Not on file  Social Connections: Not on file  Intimate Partner Violence: Not on file    No family history on file.  Past Surgical History:  Procedure Laterality Date   CYSTOSCOPY N/A 11/24/2017   Procedure: FLEX CYSTOSCOPY;  Surgeon: Bjorn Pippin, MD;  Location: [facility];  Service: Urology;  Laterality: N/A;   SCROTAL EXPLORATION Left 11/24/2017   Procedure: LEFT INGUINAL EXPLORATION WITH EXCISION OF EPIDYIMAL MASS;  Surgeon: Bjorn Pippin, MD;  Location: [facility];  Service: Urology;  Laterality: Left;    ROS: Review of Systems Negative except as stated above  PHYSICAL EXAM: BP (!) 140/95   Pulse 80   Ht 5\' 5"  (1.651 m)   Wt 205 lb (93 kg)   SpO2 98%   BMI 34.11 kg/m   Physical Exam   General appearance - alert, well appearing, and in no distress Mental status - normal mood, behavior, speech, dress, motor activity, and thought processes Chest - clear to auscultation, no wheezes, rales or rhonchi, symmetric air entry Heart - normal rate, regular rhythm, normal S1, S2, no murmurs, rubs, clicks or gallops Musculoskeletal -no tenderness on palpation of the left flank.  Right foot: No edema or erythema.  Mild tenderness on palpation of the plantar heel. Abdomen is nontender. Results for orders placed or performed in visit on 07/16/22  POCT URINALYSIS DIP (CLINITEK)  Result Value Ref Range   Color, UA colorless (A) yellow   Clarity, UA clear clear   Glucose, UA negative negative mg/dL   Bilirubin, UA negative negative   Ketones, POC UA negative negative mg/dL   Spec Grav, UA 1.610 9.604 - 1.025   Blood, UA negative negative   pH, UA 7.0 5.0 - 8.0   POC PROTEIN,UA negative negative, trace   Urobilinogen, UA 0.2 0.2 or 1.0 E.U./dL   Nitrite, UA Negative Negative   Leukocytes, UA Negative Negative  POCT glycosylated hemoglobin (Hb A1C)  Result Value Ref Range   Hemoglobin A1C     HbA1c POC (<> result, manual entry)     HbA1c, POC (prediabetic range) 5.5 (A) 5.7 - 6.4  %   HbA1c, POC (controlled diabetic range)    POCT glucose (manual entry)  Result Value Ref Range   POC Glucose 133 (A) 70 - 99 mg/dl       Latest Ref Rng & Units 05/12/2022   12:38 PM 03/19/2022    9:15 AM 12/09/2020   12:21 PM  CMP  Glucose 70 - 99 mg/dL 540  96  92   BUN 6 - 20 mg/dL 8  11  9    Creatinine 0.61 - 1.24 mg/dL 9.81  1.91  4.78   Sodium  135 - 145 mmol/L 138  140  140   Potassium 3.5 - 5.1 mmol/L 4.4  4.3  4.5   Chloride 98 - 111 mmol/L 104  103  101   CO2 22 - 32 mmol/L 23  23  23    Calcium 8.9 - 10.3 mg/dL 9.1  9.2  9.4   Total Protein 6.0 - 8.5 g/dL  7.0    Total Bilirubin 0.0 - 1.2 mg/dL  0.4    Alkaline Phos 44 - 121 IU/L  98    AST 0 - 40 IU/L  21    ALT 0 - 44 IU/L  29     Lipid Panel     Component Value Date/Time   CHOL 162 03/19/2022 0915   TRIG 137 03/19/2022 0915   HDL 38 (L) 03/19/2022 0915   CHOLHDL 4.4 04/26/2019 1705   CHOLHDL 4.4 12/22/2015 0905   VLDL 28 12/22/2015 0905   LDLCALC 100 (H) 03/19/2022 0915    CBC    Component Value Date/Time   WBC 6.8 05/12/2022 1238   RBC 5.20 05/12/2022 1238   HGB 15.7 05/12/2022 1238   HGB 16.4 03/19/2022 0915   HCT 45.8 05/12/2022 1238   HCT 47.4 03/19/2022 0915   PLT 273 05/12/2022 1238   PLT 237 03/19/2022 0915   MCV 88.1 05/12/2022 1238   MCV 88 03/19/2022 0915   MCH 30.2 05/12/2022 1238   MCHC 34.3 05/12/2022 1238   RDW 13.3 05/12/2022 1238   RDW 13.2 03/19/2022 0915   LYMPHSABS 1.2 03/19/2022 0915   MONOABS 0.5 05/27/2014 2008   EOSABS 0.3 03/19/2022 0915   BASOSABS 0.0 03/19/2022 0915    ASSESSMENT AND PLAN:  1. Flank pain, acute This resolved.  Sounds like it was musculoskeletal in nature.  UA negative for UTI. - POCT URINALYSIS DIP (CLINITEK)  2. Elevated blood pressure reading without diagnosis of hypertension DASH diet discussed and encouraged. Advised to check blood pressure at least twice a week and record the readings. Follow-up with clinical pharmacist in 1 month for  recheck.  If blood pressure still elevated, I recommend starting him on blood pressure lowering medication like amlodipine.  3. Polydipsia Screen for diabetes is negative. - POCT glycosylated hemoglobin (Hb A1C) - POCT glucose (manual entry)  4. Plantar fasciitis of right foot Discussed diagnosis with patient.  I recommend intermittently rubbing the heel over a tennis ball or cool substance like can of an open soda.  Heel stretches also demonstrated.  Take Tylenol as needed.  If no improvement, he can be referred to podiatry.   Patient was given the opportunity to ask questions.  Patient verbalized understanding of the plan and was able to repeat key elements of the plan.   This documentation was completed using Paediatric nurse.  Any transcriptional errors are unintentional.  Orders Placed This Encounter  Procedures   POCT URINALYSIS DIP (CLINITEK)   POCT glycosylated hemoglobin (Hb A1C)   POCT glucose (manual entry)     Requested Prescriptions    No prescriptions requested or ordered in this encounter    Return for BP check Luke in 1 mth.  Give f/u appt with PCP Dr. Alvis Lemmings in 2 mths.  Jonah Blue, MD, FACP

## 2022-07-16 NOTE — Patient Instructions (Signed)

## 2022-07-22 ENCOUNTER — Other Ambulatory Visit: Payer: Self-pay

## 2022-09-15 LAB — AMB RESULTS CONSOLE CBG: Glucose: 98

## 2022-09-15 NOTE — Progress Notes (Signed)
Pt has PCP.Darren Burns Pt given education on what steps he needs to take to improve blood pressure. Pt given lifestyle medicine handout and recommended to follow up with PCP.

## 2023-09-08 ENCOUNTER — Telehealth: Payer: Self-pay

## 2023-09-08 NOTE — Telephone Encounter (Signed)
 Error

## 2023-09-08 NOTE — Telephone Encounter (Signed)
 Copied from CRM [id number]. Topic: Medical Record Request - Records Request >> Sep 08, 2023 10:47 AM Travis F wrote: Reason for CRM: Patient is requesting his medical records and wants to know how to obtain them.

## 2023-09-15 NOTE — Telephone Encounter (Signed)
 Copied from CRM [id number]. Topic: Medical Record Request - Other >> Sep 15, 2023  1:25 PM Carlatta H wrote:  Reason for CRM: Please return the patients call about his medical records request from 7/24

## 2023-09-15 NOTE — Telephone Encounter (Signed)
 Called patient, and advised to contact the medical record department to obtain the records. Contact information was provided  Medical record number [id number]) [phone]

## 2023-09-22 ENCOUNTER — Other Ambulatory Visit: Payer: Self-pay

## 2023-09-22 ENCOUNTER — Ambulatory Visit: Payer: Self-pay | Attending: Family Medicine | Admitting: Family Medicine

## 2023-09-22 ENCOUNTER — Encounter: Payer: Self-pay | Admitting: Family Medicine

## 2023-09-22 VITALS — BP 128/85 | HR 67 | Ht 65.0 in | Wt 201.0 lb

## 2023-09-22 DIAGNOSIS — Z125 Encounter for screening for malignant neoplasm of prostate: Secondary | ICD-10-CM

## 2023-09-22 DIAGNOSIS — Z131 Encounter for screening for diabetes mellitus: Secondary | ICD-10-CM

## 2023-09-22 DIAGNOSIS — R519 Headache, unspecified: Secondary | ICD-10-CM

## 2023-09-22 DIAGNOSIS — I10 Essential (primary) hypertension: Secondary | ICD-10-CM

## 2023-09-22 DIAGNOSIS — M25562 Pain in left knee: Secondary | ICD-10-CM

## 2023-09-22 DIAGNOSIS — H9313 Tinnitus, bilateral: Secondary | ICD-10-CM

## 2023-09-22 DIAGNOSIS — R42 Dizziness and giddiness: Secondary | ICD-10-CM

## 2023-09-22 DIAGNOSIS — G4700 Insomnia, unspecified: Secondary | ICD-10-CM

## 2023-09-22 MED ORDER — DICLOFENAC SODIUM 1 % EX GEL
4.0000 g | Freq: Four times a day (QID) | CUTANEOUS | 1 refills | Status: DC
  Filled 2023-09-22: qty 100, 7d supply, fill #0

## 2023-09-22 NOTE — Patient Instructions (Signed)
 VISIT SUMMARY:  Today, we discussed your headaches, sleep disturbances, vertigo, knee pain, and occasional abdominal discomfort. We reviewed your blood pressure and discussed lifestyle changes to help manage your hypertension. We also talked about ways to improve your sleep and manage your knee pain.  YOUR PLAN:  -ESSENTIAL HYPERTENSION: Hypertension means high blood pressure. Your blood pressure today was 128/85, which is slightly elevated. We recommend rechecking your blood pressure, following a low sodium diet, and working on weight loss. If your blood pressure remains high, we may consider starting you on a low-dose medication.  -RIGHT KNEE PAIN AFTER INJURY: You have pain in your right knee from an injury three weeks ago. The pain is likely due to a bruise or soft tissue injury. We recommend using Voltaren  gel for inflammation and applying ice to the affected area.  -INSOMNIA: Insomnia means having trouble sleeping. Your difficulty sleeping may be due to excessive screen time and caffeine intake. We suggest exercising in the late afternoon or evening, practicing good sleep hygiene, using melatonin as a natural sleep aid, drinking chamomile tea, and avoiding caffeine in the evening.  -HEADACHE: Your morning headaches may be related to poor sleep or high blood pressure. We will address your insomnia and hypertension to help reduce your headaches.  -VERTIGO: Vertigo is a sensation of spinning or dizziness. Your vertigo is intermittent and may be related to inner ear issues. If it worsens, we may refer you for vestibular rehabilitation.  -TINNITUS: Tinnitus is ringing in the ears. Your tinnitus may be related to earwax and worsens when you have vertigo. We will check for earwax and consider removal if necessary. Using earbuds may help diminish the tinnitus.  INSTRUCTIONS:  Please recheck your blood pressure regularly and follow the recommended lifestyle changes. If your knee pain persists or  worsens, or if your vertigo becomes more frequent or severe, please schedule a follow-up appointment. Additionally, if you experience any new or worsening symptoms, contact our office.

## 2023-09-22 NOTE — Progress Notes (Addendum)
 Subjective:  Patient ID: Darren Burns, male    DOB: 02-27-1975  Age: 48 y.o. MRN: [id number]  CC: Medical Management of Chronic Issues (Headaches/Not sleeping/Knee pain)     Discussed the use of AI scribe software for clinical note transcription with the patient, who gave verbal consent to proceed.  History of Present Illness Darren Burns is a 48 year old male with hypertension who presents with headaches and sleep disturbances.  Headaches occur primarily in the mornings. Hypertension was noted in July of last year. Management includes exercise and dietary changes but he endorses occasional consumption of high-sodium foods.  He admits to excessive screen time and difficulty falling asleep due to racing thoughts. Sleep onset is around midnight or 1:00 AM, with waking at 6:00 or 7:00 AM.  He is trying to put his device is far away from him and to turn off the TV when going to bed. He does not snore or experience breathlessness but sleeps lightly.  Vertigo has been intermittent over the past five to seven years, occurring once or twice a year, often with tinnitus, and is more pronounced with positional changes.  A recent left knee injury occurred three weeks ago while he was kneeling in the attic but felt his knee slide over an obstacle with a 'pop' sound. Pain is present when kneeling, rated 7 out of 10, without significant swelling.  Occasional abdominal fullness or discomfort occurs with eating or drinking, described as 'gas pains'. A stool test was recommended but not completed.    Past Medical History:  Diagnosis Date   Epididymal mass    left   Frequency of urination    GERD (gastroesophageal reflux disease)    History of exercise intolerance 10/2017   negative ETT, no ischemia   History of Helicobacter pylori infection 07/2017    Past Surgical History:  Procedure Laterality Date   CYSTOSCOPY N/A 11/24/2017   Procedure: FLEX CYSTOSCOPY;   Surgeon: Watt Rush, MD;  Location: [facility];  Service: Urology;  Laterality: N/A;   SCROTAL EXPLORATION Left 11/24/2017   Procedure: LEFT INGUINAL EXPLORATION WITH EXCISION OF EPIDYIMAL MASS;  Surgeon: Watt Rush, MD;  Location: [facility];  Service: Urology;  Laterality: Left;    No family history on file.  Social History   Socioeconomic History   Marital status: Married    Spouse name: Not on file   Number of children: Not on file   Years of education: Not on file   Highest education level: Not on file  Occupational History   Not on file  Tobacco Use   Smoking status: Former    Current packs/day: 0.00    Types: Cigarettes    Start date: 02/16/1988    Quit date: 02/15/1998    Years since quitting: 25.6   Smokeless tobacco: Never  Vaping Use   Vaping status: Never Used  Substance and Sexual Activity   Alcohol use: No   Drug use: Not Currently    Types: Cocaine, Crack cocaine, Marijuana    Comment: 11-22-2017 per pt last cocaine 2001   Sexual activity: Yes  Other Topics Concern   Not on file  Social History Narrative   Not on file   Social Drivers of Health   Financial Resource Strain: Not on file  Food Insecurity: Not on file  Transportation Needs: Not on file  Physical Activity: Not on file  Stress: Not on file  Social Connections: Not on file    Allergies  Allergen Reactions  Ibuprofen Swelling and Other (See Comments)    Lip swelling   Tamiflu [Oseltamivir Phosphate] Nausea And Vomiting    Outpatient Medications Prior to Visit  Medication Sig Dispense Refill   acetaminophen  (TYLENOL ) 500 MG tablet Take 1,000 mg by mouth every 6 (six) hours as needed for moderate pain or headache. (Patient not taking: Reported on 09/22/2023)     albuterol  (PROVENTIL  HFA) 108 (90 Base) MCG/ACT inhaler Inhale 2 puffs into the lungs every 6 (six) hours as needed for wheezing or shortness of breath. (Patient not taking: Reported on 09/22/2023) 6.7 g 3   cetirizine  (ZYRTEC ) 10 MG  tablet Take 1 tablet (10 mg total) by mouth daily. (Patient not taking: Reported on 09/22/2023) 30 tablet 1   doxycycline  (VIBRAMYCIN ) 100 MG capsule Take 1 capsule (100 mg total) by mouth 2 (two) times daily. (Patient not taking: Reported on 09/22/2023) 20 capsule 0   hydrOXYzine  (ATARAX /VISTARIL ) 25 MG tablet Take 1 tablet (25 mg total) by mouth at bedtime as needed. (Patient not taking: Reported on 09/22/2023) 30 tablet 2   omeprazole  (PRILOSEC) 20 MG capsule Take 1 capsule (20 mg total) by mouth daily. (Patient not taking: Reported on 09/22/2023) 30 capsule 3   polyethylene glycol powder (GLYCOLAX /MIRALAX ) powder Take 17 g by mouth daily. (Patient not taking: Reported on 09/22/2023) 3350 g 1   predniSONE  (DELTASONE ) 20 MG tablet Take 1 tablet (20 mg total) by mouth daily with breakfast. (Patient not taking: Reported on 09/22/2023) 5 tablet 0   terbinafine  (LAMISIL ) 250 MG tablet Take 1 tablet (250 mg total) by mouth daily. (Patient not taking: Reported on 09/22/2023) 30 tablet 2   tiZANidine  (ZANAFLEX ) 4 MG tablet Take 1 tablet (4 mg total) by mouth every 8 (eight) hours as needed. (Patient not taking: Reported on 09/22/2023) 60 tablet 1   No facility-administered medications prior to visit.     ROS Review of Systems  Constitutional:  Negative for activity change and appetite change.  HENT:  Negative for sinus pressure and sore throat.   Respiratory:  Negative for chest tightness, shortness of breath and wheezing.   Cardiovascular:  Negative for chest pain and palpitations.  Gastrointestinal:  Negative for abdominal distention, abdominal pain and constipation.  Genitourinary: Negative.   Musculoskeletal: Negative.        See HPI  Neurological:  Positive for headaches.  Psychiatric/Behavioral:  Positive for sleep disturbance. Negative for behavioral problems and dysphoric mood.     Objective:  BP 128/85   Pulse 67   Ht 5' 5 (1.651 m)   Wt 201 lb (91.2 kg)   SpO2 99%   BMI 33.45 kg/m       09/22/2023   11:40 AM 09/22/2023   11:10 AM 09/15/2022    4:10 PM  BP/Weight  Systolic BP 128 136 148  Diastolic BP 85 92 98  Wt. (Lbs)  201   BMI  33.45 kg/m2       Physical Exam Constitutional:      Appearance: He is well-developed.  Cardiovascular:     Rate and Rhythm: Normal rate.     Heart sounds: Normal heart sounds. No murmur heard. Pulmonary:     Effort: Pulmonary effort is normal.     Breath sounds: Normal breath sounds. No wheezing or rales.  Chest:     Chest wall: No tenderness.  Abdominal:     General: Bowel sounds are normal. There is no distension.     Palpations: Abdomen is soft. There is no mass.  Tenderness: There is no abdominal tenderness.  Musculoskeletal:     Right lower leg: No edema.     Left lower leg: No edema.     Comments: No tenderness on palpation of both knees, no edema. Normal range of motion bilaterally  Neurological:     Mental Status: He is alert and oriented to person, place, and time.  Psychiatric:        Mood and Affect: Mood normal.        Latest Ref Rng & Units 05/12/2022   12:38 PM 03/19/2022    9:15 AM 12/09/2020   12:21 PM  CMP  Glucose 70 - 99 mg/dL 899  96  92   BUN 6 - 20 mg/dL 8  11  9    Creatinine 0.61 - 1.24 mg/dL 9.04  9.09  9.15   Sodium 135 - 145 mmol/L 138  140  140   Potassium 3.5 - 5.1 mmol/L 4.4  4.3  4.5   Chloride 98 - 111 mmol/L 104  103  101   CO2 22 - 32 mmol/L 23  23  23    Calcium 8.9 - 10.3 mg/dL 9.1  9.2  9.4   Total Protein 6.0 - 8.5 g/dL  7.0    Total Bilirubin 0.0 - 1.2 mg/dL  0.4    Alkaline Phos 44 - 121 IU/L  98    AST 0 - 40 IU/L  21    ALT 0 - 44 IU/L  29      Lipid Panel     Component Value Date/Time   CHOL 162 03/19/2022 0915   TRIG 137 03/19/2022 0915   HDL 38 (L) 03/19/2022 0915   CHOLHDL 4.4 04/26/2019 1705   CHOLHDL 4.4 12/22/2015 0905   VLDL 28 12/22/2015 0905   LDLCALC 100 (H) 03/19/2022 0915    CBC    Component Value Date/Time   WBC 6.8 05/12/2022 1238   RBC 5.20  05/12/2022 1238   HGB 15.7 05/12/2022 1238   HGB 16.4 03/19/2022 0915   HCT 45.8 05/12/2022 1238   HCT 47.4 03/19/2022 0915   PLT 273 05/12/2022 1238   PLT 237 03/19/2022 0915   MCV 88.1 05/12/2022 1238   MCV 88 03/19/2022 0915   MCH 30.2 05/12/2022 1238   MCHC 34.3 05/12/2022 1238   RDW 13.3 05/12/2022 1238   RDW 13.2 03/19/2022 0915   LYMPHSABS 1.2 03/19/2022 0915   MONOABS 0.5 05/27/2014 2008   EOSABS 0.3 03/19/2022 0915   BASOSABS 0.0 03/19/2022 0915    Lab Results  Component Value Date   HGBA1C 5.5 (A) 07/16/2022       Assessment & Plan Essential hypertension Blood pressure was initially elevated at previous visit and today at 136/92 but on repeat is currently 128/85, with diastolic slightly elevated. Headaches may be related to hypertension or poor sleep. - Recheck blood pressure. - Encourage low sodium diet. - Encourage weight loss. - Consider starting low-dose antihypertensive medication if blood pressure remains elevated at next visit  Left knee pain after injury Right knee pain following an injury three weeks ago. Pain significant when kneeling, likely a bruise or soft tissue injury. No swelling observed. No x-ray needed. - Prescribe Voltaren  gel for inflammation. - Apply ice to the affected area.  Insomnia Difficulty sleeping, possibly due to excessive screen time and caffeine intake. Prefers to avoid medication. - Encourage exercise in the late afternoon or evening. - Advise on sleep hygiene practices. - Suggest melatonin as a natural sleep aid. -  Recommend chamomile tea for sleep. - Advise against caffeine intake in the evening.  Headache Morning headaches possibly related to poor sleep or hypertension. - Address insomnia and hypertension as potential causes.  Vertigo Intermittent vertigo possibly related to inner ear issues. No recent increase in frequency or severity. - Consider referral for vestibular rehabilitation if vertigo  worsens.  Tinnitus Ringing in the ears, possibly related to earwax. Worsening of tinnitus when vertigo occurs. - Check for earwax and consider removal if necessary. - Suggest using earbuds to diminish tinnitus.     Healthcare maintenance History provided FIT test for colon cancer screening which he is yet to return and has been advised to do so. Ordered PSA to screen for prostate cancer  Meds ordered this encounter  Medications   diclofenac  Sodium (VOLTAREN ) 1 % GEL    Sig: Apply 4 g topically 4 (four) times daily.    Dispense:  100 g    Refill:  1    Follow-up: Return in about 6 months (around 03/24/2024) for Hypertension.       Corrina Sabin, MD, FAAFP. [facility] [place], [place] [phone]   09/22/2023, 11:50 AM

## 2023-09-22 NOTE — Addendum Note (Signed)
 Addended by: Yao Hyppolite on: 09/22/2023 11:55 AM   Modules accepted: Orders

## 2023-10-05 ENCOUNTER — Other Ambulatory Visit: Payer: Self-pay

## 2023-10-09 ENCOUNTER — Encounter: Payer: Self-pay | Admitting: Family Medicine

## 2023-10-10 ENCOUNTER — Encounter: Payer: Self-pay | Admitting: Family Medicine

## 2023-10-10 ENCOUNTER — Ambulatory Visit: Payer: Self-pay

## 2023-10-10 ENCOUNTER — Ambulatory Visit: Payer: Self-pay | Attending: Family Medicine | Admitting: Family Medicine

## 2023-10-10 ENCOUNTER — Other Ambulatory Visit: Payer: Self-pay

## 2023-10-10 VITALS — BP 141/91 | HR 65 | Ht 65.0 in | Wt 200.2 lb

## 2023-10-10 DIAGNOSIS — I10 Essential (primary) hypertension: Secondary | ICD-10-CM

## 2023-10-10 DIAGNOSIS — R55 Syncope and collapse: Secondary | ICD-10-CM

## 2023-10-10 DIAGNOSIS — Z131 Encounter for screening for diabetes mellitus: Secondary | ICD-10-CM

## 2023-10-10 DIAGNOSIS — R0789 Other chest pain: Secondary | ICD-10-CM

## 2023-10-10 DIAGNOSIS — Z125 Encounter for screening for malignant neoplasm of prostate: Secondary | ICD-10-CM

## 2023-10-10 DIAGNOSIS — M62838 Other muscle spasm: Secondary | ICD-10-CM

## 2023-10-10 DIAGNOSIS — R351 Nocturia: Secondary | ICD-10-CM

## 2023-10-10 MED ORDER — AMLODIPINE BESYLATE 2.5 MG PO TABS
2.5000 mg | ORAL_TABLET | Freq: Every day | ORAL | 1 refills | Status: AC
  Filled 2023-10-10: qty 90, 90d supply, fill #0
  Filled 2024-01-18 (×2): qty 90, 90d supply, fill #1

## 2023-10-10 NOTE — Progress Notes (Signed)
 Subjective:  Patient ID: Darren Burns, male    DOB: 1975-06-30  Age: 48 y.o. MRN: [id number]  CC: Loss of Consciousness (Requesting lab work)     Discussed the use of AI scribe software for clinical note transcription with the patient, who gave verbal consent to proceed.  History of Present Illness Darren Burns is a 48 year old male with a history of hypertension who presents with chest discomfort and near syncope. Accompanied by wife to this visit.  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 Following the chest pain, he experienced numbness in his mouth and near syncope. He regained composure by shaking his head and breathing deeply. His blood pressure was initially recorded as 180s over something, and later as 160/110 by the fire department. An EKG performed by EMS showed no abnormalities.  He has elevated blood pressure readings which have normalized on repeat but has not been on any blood pressure medication. He was sweating profusely during the episode, which he attributes to physical exertion. He did not eat anything in the morning before the episode.  He goes to bed at midnight and wakes up at 6:30 AM, with interruptions two to three times a night to urinate. He describes the urine as clear and in large amounts.  He was seen for insomnia at his last office visit and we had discussed sleep hygiene and use of melatonin.  He recalls a similar episode of near syncope during exercise about five years ago, attributed to overexertion.  He also does have slight pain on the left upper back at the place where he was lifting speakers.  Past Medical History:  Diagnosis Date   Epididymal mass    left   Frequency of urination    GERD (gastroesophageal reflux disease)    History of exercise intolerance 10/2017   negative ETT, no ischemia   History of Helicobacter  pylori infection 07/2017    Past Surgical History:  Procedure Laterality Date   CYSTOSCOPY N/A 11/24/2017   Procedure: FLEX CYSTOSCOPY;  Surgeon: Watt Rush, MD;  Location: [facility];  Service: Urology;  Laterality: N/A;   SCROTAL EXPLORATION Left 11/24/2017   Procedure: LEFT INGUINAL EXPLORATION WITH EXCISION OF EPIDYIMAL MASS;  Surgeon: Watt Rush, MD;  Location: [facility];  Service: Urology;  Laterality: Left;    History reviewed. No pertinent family history.  Social History   Socioeconomic History   Marital status: Married    Spouse name: Not on file   Number of children: Not on file   Years of education: Not on file   Highest education level: Not on file  Occupational History   Not on file  Tobacco Use   Smoking status: Former    Current packs/day: 0.00    Types: Cigarettes    Start date: 02/16/1988    Quit date: 02/15/1998    Years since quitting: 25.6   Smokeless tobacco: Never  Vaping Use   Vaping status: Never Used  Substance and Sexual Activity   Alcohol use: No   Drug use: Not Currently    Types: Cocaine, Crack cocaine, Marijuana    Comment: 11-22-2017 per pt last cocaine 2001   Sexual activity: Yes  Other Topics Concern   Not on file  Social History Narrative   Not on file   Social Drivers of Health   Financial Resource Strain: Not on file  Food  Insecurity: Not on file  Transportation Needs: Not on file  Physical Activity: Not on file  Stress: Not on file  Social Connections: Not on file    Allergies  Allergen Reactions   Ibuprofen Swelling and Other (See Comments)    Lip swelling   Tamiflu [Oseltamivir Phosphate] Nausea And Vomiting    Outpatient Medications Prior to Visit  Medication Sig Dispense Refill   acetaminophen  (TYLENOL ) 500 MG tablet Take 1,000 mg by mouth every 6 (six) hours as needed for moderate pain or headache. (Patient not taking: Reported on 09/22/2023)     albuterol  (PROVENTIL  HFA) 108 (90 Base) MCG/ACT inhaler Inhale 2 puffs into  the lungs every 6 (six) hours as needed for wheezing or shortness of breath. (Patient not taking: Reported on 09/22/2023) 6.7 g 3   cetirizine  (ZYRTEC ) 10 MG tablet Take 1 tablet (10 mg total) by mouth daily. (Patient not taking: Reported on 09/22/2023) 30 tablet 1   diclofenac  Sodium (VOLTAREN ) 1 % GEL Apply 4 g topically 4 (four) times daily. 100 g 1   doxycycline  (VIBRAMYCIN ) 100 MG capsule Take 1 capsule (100 mg total) by mouth 2 (two) times daily. (Patient not taking: Reported on 09/22/2023) 20 capsule 0   hydrOXYzine  (ATARAX /VISTARIL ) 25 MG tablet Take 1 tablet (25 mg total) by mouth at bedtime as needed. (Patient not taking: Reported on 09/22/2023) 30 tablet 2   omeprazole  (PRILOSEC) 20 MG capsule Take 1 capsule (20 mg total) by mouth daily. (Patient not taking: Reported on 09/22/2023) 30 capsule 3   polyethylene glycol powder (GLYCOLAX /MIRALAX ) powder Take 17 g by mouth daily. (Patient not taking: Reported on 09/22/2023) 3350 g 1   predniSONE  (DELTASONE ) 20 MG tablet Take 1 tablet (20 mg total) by mouth daily with breakfast. (Patient not taking: Reported on 09/22/2023) 5 tablet 0   terbinafine  (LAMISIL ) 250 MG tablet Take 1 tablet (250 mg total) by mouth daily. (Patient not taking: Reported on 09/22/2023) 30 tablet 2   tiZANidine  (ZANAFLEX ) 4 MG tablet Take 1 tablet (4 mg total) by mouth every 8 (eight) hours as needed. (Patient not taking: Reported on 09/22/2023) 60 tablet 1   No facility-administered medications prior to visit.     ROS Review of Systems  Constitutional:  Negative for activity change and appetite change.  HENT:  Negative for sinus pressure and sore throat.   Respiratory:  Negative for chest tightness, shortness of breath and wheezing.   Cardiovascular:  Positive for chest pain. Negative for palpitations.  Gastrointestinal:  Negative for abdominal distention, abdominal pain and constipation.  Genitourinary: Negative.   Musculoskeletal:        See HPI  Neurological:  Positive for  weakness.  Psychiatric/Behavioral:  Positive for sleep disturbance. Negative for behavioral problems and dysphoric mood.     Objective:  BP (!) 141/91   Pulse 65   Ht 5' 5 (1.651 m)   Wt 200 lb 3.2 oz (90.8 kg)   SpO2 99%   BMI 33.32 kg/m      10/10/2023   12:17 PM 10/10/2023   11:29 AM 09/22/2023   11:40 AM  BP/Weight  Systolic BP 141 154 128  Diastolic BP 91 95 85  Wt. (Lbs)  200.2   BMI  33.32 kg/m2       Physical Exam Constitutional:      Appearance: He is well-developed.  Cardiovascular:     Rate and Rhythm: Normal rate.     Heart sounds: Normal heart sounds. No murmur heard. Pulmonary:  Effort: Pulmonary effort is normal.     Breath sounds: Normal breath sounds. No wheezing or rales.  Chest:     Chest wall: No tenderness.  Abdominal:     General: Bowel sounds are normal. There is no distension.     Palpations: Abdomen is soft. There is no mass.     Tenderness: There is no abdominal tenderness.  Musculoskeletal:        General: Normal range of motion.     Right lower leg: No edema.     Left lower leg: No edema.     Comments: Slight tenderness on palpation of left trapezius muscle  Neurological:     Mental Status: He is alert and oriented to person, place, and time.  Psychiatric:        Mood and Affect: Mood normal.        Latest Ref Rng & Units 05/12/2022   12:38 PM 03/19/2022    9:15 AM 12/09/2020   12:21 PM  CMP  Glucose 70 - 99 mg/dL 899  96  92   BUN 6 - 20 mg/dL 8  11  9    Creatinine 0.61 - 1.24 mg/dL 9.04  9.09  9.15   Sodium 135 - 145 mmol/L 138  140  140   Potassium 3.5 - 5.1 mmol/L 4.4  4.3  4.5   Chloride 98 - 111 mmol/L 104  103  101   CO2 22 - 32 mmol/L 23  23  23    Calcium 8.9 - 10.3 mg/dL 9.1  9.2  9.4   Total Protein 6.0 - 8.5 g/dL  7.0    Total Bilirubin 0.0 - 1.2 mg/dL  0.4    Alkaline Phos 44 - 121 IU/L  98    AST 0 - 40 IU/L  21    ALT 0 - 44 IU/L  29      Lipid Panel     Component Value Date/Time   CHOL 162 03/19/2022  0915   TRIG 137 03/19/2022 0915   HDL 38 (L) 03/19/2022 0915   CHOLHDL 4.4 04/26/2019 1705   CHOLHDL 4.4 12/22/2015 0905   VLDL 28 12/22/2015 0905   LDLCALC 100 (H) 03/19/2022 0915    CBC    Component Value Date/Time   WBC 6.8 05/12/2022 1238   RBC 5.20 05/12/2022 1238   HGB 15.7 05/12/2022 1238   HGB 16.4 03/19/2022 0915   HCT 45.8 05/12/2022 1238   HCT 47.4 03/19/2022 0915   PLT 273 05/12/2022 1238   PLT 237 03/19/2022 0915   MCV 88.1 05/12/2022 1238   MCV 88 03/19/2022 0915   MCH 30.2 05/12/2022 1238   MCHC 34.3 05/12/2022 1238   RDW 13.3 05/12/2022 1238   RDW 13.2 03/19/2022 0915   LYMPHSABS 1.2 03/19/2022 0915   MONOABS 0.5 05/27/2014 2008   EOSABS 0.3 03/19/2022 0915   BASOSABS 0.0 03/19/2022 0915    Lab Results  Component Value Date   HGBA1C 5.5 (A) 07/16/2022       Assessment & Plan Near Syncope and chest pain Intermittent chest pain and near syncope during exertion, possibly related to exertion and dehydration. EKG by EMS showed no abnormalities. - Perform 12-lead EKG to rule out cardiac issues -EKG revealed normal sinus rhythm with no ST changes - Ensure adequate hydration, especially during exertion.  Essential hypertension Elevated blood pressure noted during exertion, likely exacerbated by exertion and dietary factors. Blood pressure was 160/110 post-episode. Discussed need for consistent control during rest and exertion. -  Prescribe amlodipine  2.5 mg daily. - Advise home blood pressure monitoring with an arm cuff device. - Encourage dietary modifications to reduce sodium intake. - Recheck blood pressure in the office after rest.  Nocturia Frequent nocturnal urination with large volumes, possibly due to fluid intake before bedtime.  - We discussed that these are also symptoms of prostate enlargement. - Advise reducing fluid intake, especially tea, before bedtime. - Consider initiation of Flomax if symptoms persist  Muscle spasm of back Muscle  spasm following lifting activity, pain localized to back, exacerbated by movement, affecting neck and shoulder area. - Recommend use of ice and massage for symptomatic relief.  General Health Maintenance Emphasized maintaining hydration, dietary modifications, and regular blood pressure monitoring. - Encourage hydration, especially during physical activity. - Advise on dietary modifications to reduce sodium intake. - Encourage regular monitoring of blood pressure at home.  Follow-Up Plan to monitor blood pressure and assess response to treatment. - Schedule follow-up appointment in three months. - Review blood pressure readings and dietary adherence at follow-up.     Healthcare maintenance: Screening for prostate cancer PSA ordered  Meds ordered this encounter  Medications   amLODipine  (NORVASC ) 2.5 MG tablet    Sig: Take 1 tablet (2.5 mg total) by mouth daily.    Dispense:  90 tablet    Refill:  1    Follow-up: Return in about 3 months (around 01/10/2024) for Blood Pressure follow-up with PCP.       Corrina Sabin, MD, FAAFP. [facility] [place], [place] [phone]   10/10/2023, 12:22 PM

## 2023-10-10 NOTE — Patient Instructions (Signed)
 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 We discussed your symptoms, including elevated blood pressure readings, and reviewed your overall health maintenance.  YOUR PLAN:  -NEAR SYNCOPE AND CHEST PAIN: Syncope means a temporary loss of consciousness, often described as fainting. Your chest pain and near-fainting episode may be related to physical exertion and dehydration. We will perform a 12-lead EKG to rule out any heart issues. Make sure to stay well-hydrated, especially during physical activities.  Your EKG reveals no evidence of cardiac disease which is reassuring.  -ESSENTIAL HYPERTENSION: Essential hypertension is high blood pressure with no identifiable cause. Your blood pressure was elevated during the episode. We have prescribed amlodipine  2.5 mg daily to help control your blood pressure. Please monitor your blood pressure at home using an arm cuff device and reduce your sodium intake. We will recheck your blood pressure in the office after you have rested.  -NOCTURIA: Nocturia is the need to wake up at night to urinate. You are experiencing frequent urination at night, possibly due to fluid intake before bedtime. Try to reduce your fluid intake, especially tea, before going to bed.  -MUSCLE SPASM OF BACK: A muscle spasm is an involuntary contraction of a muscle. You have a muscle spasm in your back, likely from lifting activity, which is causing pain in your neck and shoulder area. Use ice and massage to help relieve the pain.  -GENERAL HEALTH MAINTENANCE: We discussed the importance of staying hydrated, especially during physical activity, and making dietary changes to reduce sodium intake. Regularly monitor your blood pressure at home.  INSTRUCTIONS:  Please schedule a follow-up appointment in three months to monitor your blood pressure and assess your response to the treatment.  Bring your blood pressure readings and note any dietary changes you have made to the follow-up appointment.

## 2023-10-10 NOTE — Telephone Encounter (Signed)
 FYI Only or Action Required?: FYI only for provider.  Patient was last seen in primary care on 09/22/2023 by [name], Enobong, MD.  Called Nurse Triage reporting Chest Pain.  Symptoms began yesterday.  Interventions attempted: Other: EMS was called for assessment.  Symptoms are: stable.  Triage Disposition: Call PCP Within 24 Hours  Patient/caregiver understands and will follow disposition?: Yes      Copied from CRM #[id number]. Topic: Clinical - Red Word Triage >> Oct 10, 2023  8:28 AM Darren Burns wrote: Reason for CRM: patient called stating yesterday he was dancing and singing at church and he felt a sharp pain in his chest. Patient stated he felt his mouth numb and it was almost like he was going to fall out. Patient stated his BP 160/110 and then it went down to 131/88 after ten minutes. The paramedics were called and they did an EKG and they said everything was fine and they did not see any problems with his heart Reason for Disposition  [1] Follow-up call from patient regarding patient's clinical status AND [2] information NON-URGENT  Answer Assessment - Initial Assessment Questions Patient was at church yesterday and was dancing and singing and had chest pain and felt faint. The church called EMS, BP was 160/110. EMS did EKG and everything looked normal. They recommended he get in with primary care to get further testing. Paramedics told him when they finished the assessment he was having a hypertensive episode. Patient states he is not currently experiencing symptoms today. His BP this morning upon waking up was 131/86.  Protocols used: PCP Call - No Triage-A-AH

## 2023-10-11 ENCOUNTER — Ambulatory Visit: Payer: Self-pay | Admitting: Family Medicine

## 2023-10-11 ENCOUNTER — Telehealth: Payer: Self-pay

## 2023-10-11 LAB — LP+NON-HDL CHOLESTEROL
Cholesterol, Total: 182 mg/dL (ref 100–199)
HDL: 42 mg/dL (ref >39)
LDL Chol Calc (NIH): 120 mg/dL — ABNORMAL HIGH (ref 0–99)
Total Non-HDL-Chol (LDL+VLDL): 140 mg/dL — ABNORMAL HIGH (ref 0–129)
Triglycerides: 110 mg/dL (ref 0–149)
VLDL Cholesterol Cal: 20 mg/dL (ref 5–40)

## 2023-10-11 LAB — CBC WITH DIFFERENTIAL/PLATELET
Basophils Absolute: 0 x10E3/uL (ref 0.0–0.2)
Basos: 1 %
EOS (ABSOLUTE): 0.1 x10E3/uL (ref 0.0–0.4)
Eos: 2 %
Hematocrit: 49.8 % (ref 37.5–51.0)
Hemoglobin: 16.7 g/dL (ref 13.0–17.7)
Immature Grans (Abs): 0 x10E3/uL (ref 0.0–0.1)
Immature Granulocytes: 0 %
Lymphocytes Absolute: 1.5 x10E3/uL (ref 0.7–3.1)
Lymphs: 26 %
MCH: 30.6 pg (ref 26.6–33.0)
MCHC: 33.5 g/dL (ref 31.5–35.7)
MCV: 91 fL (ref 79–97)
Monocytes Absolute: 0.6 x10E3/uL (ref 0.1–0.9)
Monocytes: 11 %
Neutrophils Absolute: 3.5 x10E3/uL (ref 1.4–7.0)
Neutrophils: 60 %
Platelets: 266 x10E3/uL (ref 150–450)
RBC: 5.45 x10E6/uL (ref 4.14–5.80)
RDW: 13.5 % (ref 11.6–15.4)
WBC: 5.8 x10E3/uL (ref 3.4–10.8)

## 2023-10-11 LAB — CMP14+EGFR
ALT: 32 IU/L (ref 0–44)
AST: 21 IU/L (ref 0–40)
Albumin: 4.3 g/dL (ref 4.1–5.1)
Alkaline Phosphatase: 98 IU/L (ref 44–121)
BUN/Creatinine Ratio: 14 (ref 9–20)
BUN: 12 mg/dL (ref 6–24)
Bilirubin Total: 0.4 mg/dL (ref 0.0–1.2)
CO2: 24 mmol/L (ref 20–29)
Calcium: 9.5 mg/dL (ref 8.7–10.2)
Chloride: 101 mmol/L (ref 96–106)
Creatinine, Ser: 0.86 mg/dL (ref 0.76–1.27)
Globulin, Total: 2.8 g/dL (ref 1.5–4.5)
Glucose: 88 mg/dL (ref 70–99)
Potassium: 4.9 mmol/L (ref 3.5–5.2)
Sodium: 138 mmol/L (ref 134–144)
Total Protein: 7.1 g/dL (ref 6.0–8.5)
eGFR: 105 mL/min/1.73 (ref >59)

## 2023-10-11 LAB — PSA, TOTAL AND FREE
PSA, Free Pct: 24.8 %
PSA, Free: 0.52 ng/mL
Prostate Specific Ag, Serum: 2.1 ng/mL (ref 0.0–4.0)

## 2023-10-11 LAB — HEMOGLOBIN A1C
Est. average glucose Bld gHb Est-mCnc: 111 mg/dL
Hgb A1c MFr Bld: 5.5 % (ref 4.8–5.6)

## 2023-10-11 NOTE — Telephone Encounter (Signed)
 Spoke with patient . Patient wanted to know lab results. Labs results reviewed per PCP 'Your cholesterol reveals the bad cholesterol (LDL) is slightly elevated compared to 1 year ago. Medication is not indicated at this time as you do have a low cardiovascular risk and so we can work on lifestyle modifications at the moment and repeat at a subsequent visit. You are not anemic, kidney and liver functions are normal, screening for prostate cancer is negative.    Patient voiced that he wanted to ask PCP if they thought he should go to a heart specialist. Advised Patient I would send his PCP a message for advisement.

## 2023-10-11 NOTE — Telephone Encounter (Signed)
 Copied from CRM #[id number]. Topic: Clinical - Lab/Test Results >> Oct 11, 2023  9:42 AM Amy B wrote: Reason for CRM: Patient requests for a call back to discuss lab results.  He was seen in clinic yesterday and has several questions.

## 2023-10-11 NOTE — Telephone Encounter (Signed)
 At the moment, he has no diagnosis indicating a cardiology referral.  If he has chest pains I would recommend he go to the ED right away.  If he has a diagnosis that he thinks warrants a cardiology referral or he can let me know what it is and I can place a referral for him.

## 2023-10-12 NOTE — Telephone Encounter (Signed)
 Patient is aware that if he has chest p[ain he should got to ED and no referral needed at this time

## 2023-10-14 ENCOUNTER — Ambulatory Visit (HOSPITAL_COMMUNITY)
Admission: EM | Admit: 2023-10-14 | Discharge: 2023-10-14 | Disposition: A | Discharge status: Home / Self Care | Payer: Self-pay

## 2023-10-14 ENCOUNTER — Other Ambulatory Visit: Payer: Self-pay

## 2023-10-14 ENCOUNTER — Encounter (HOSPITAL_COMMUNITY): Payer: Self-pay | Admitting: *Deleted

## 2023-10-14 ENCOUNTER — Ambulatory Visit: Payer: Self-pay | Admitting: *Deleted

## 2023-10-14 DIAGNOSIS — I10 Essential (primary) hypertension: Secondary | ICD-10-CM

## 2023-10-14 HISTORY — DX: Essential (primary) hypertension: I10

## 2023-10-14 NOTE — Discharge Instructions (Signed)
  1. Hypertension, unspecified type (Primary) - ED EKG completed in UC shows normal sinus rhythm with ventricular rate of 76 bpm, no STEMI, normal EKG. - Discussed with patient continuing to take medication as prescribed and check blood pressure after resting with feet flat on the floor, will arm elevated to the level of the heart and after sitting in this position for 5 minutes before taking blood pressure. - Advised to continue medication and follow-up with primary care provider as scheduled for follow-up evaluation of blood pressure readings. -Continue to monitor symptoms for any change in severity if there is any escalation of current symptoms or development of new symptoms follow-up in ER for further evaluation and management.

## 2023-10-14 NOTE — ED Triage Notes (Addendum)
 Started with amlodipine  2.5mg  5 days ago for the first time. This AM his BP 151/98 and 148/97, approx 10 min after his med dose. Pt proceeded to check BP about 10 times with elevated results. States has f/u set up with PCP in few months. Also c/o of my chest feeling a little bit tight, worse with breathing; states tightness is improving. States had normal EKG done 5 days ago.

## 2023-10-14 NOTE — ED Provider Notes (Signed)
 [facility]   Note:  This document was prepared using Dragon voice recognition software and may include unintentional dictation errors.  MRN: [id number] DOB: 04-12-1975  Subjective:   Darren Burns is a 48 y.o. male presenting for multiple elevated blood pressure readings today after starting amlodipine  2.5 mg on Monday for blood pressure control.  Patient reports for the last several days since starting medication he has had normal blood pressure readings however today he had several elevated blood pressure readings on home blood pressure cuff.  Patient was concerned which prompted him to follow-up in urgent care for evaluation.  Patient denies any chest pain, shortness of breath, weakness, dizziness.  No current facility-administered medications for this encounter.  Current Outpatient Medications:    amLODipine  (NORVASC ) 2.5 MG tablet, Take 1 tablet (2.5 mg total) by mouth daily., Disp: 90 tablet, Rfl: 1   acetaminophen  (TYLENOL ) 500 MG tablet, Take 1,000 mg by mouth every 6 (six) hours as needed for moderate pain or headache. (Patient not taking: Reported on 09/22/2023), Disp: , Rfl:    albuterol  (PROVENTIL  HFA) 108 (90 Base) MCG/ACT inhaler, Inhale 2 puffs into the lungs every 6 (six) hours as needed for wheezing or shortness of breath. (Patient not taking: Reported on 09/22/2023), Disp: 6.7 g, Rfl: 3   cetirizine  (ZYRTEC ) 10 MG tablet, Take 1 tablet (10 mg total) by mouth daily. (Patient not taking: No sig reported), Disp: 30 tablet, Rfl: 1   diclofenac  Sodium (VOLTAREN ) 1 % GEL, Apply 4 g topically 4 (four) times daily., Disp: 100 g, Rfl: 1   doxycycline  (VIBRAMYCIN ) 100 MG capsule, Take 1 capsule (100 mg total) by mouth 2 (two) times daily. (Patient not taking: Reported on 07/16/2022), Disp: 20 capsule, Rfl: 0   hydrOXYzine  (ATARAX /VISTARIL ) 25 MG tablet, Take 1 tablet (25 mg total) by mouth at bedtime as needed. (Patient not taking: Reported on 07/16/2022), Disp: 30  tablet, Rfl: 2   omeprazole  (PRILOSEC) 20 MG capsule, Take 1 capsule (20 mg total) by mouth daily. (Patient not taking: No sig reported), Disp: 30 capsule, Rfl: 3   polyethylene glycol powder (GLYCOLAX /MIRALAX ) powder, Take 17 g by mouth daily. (Patient not taking: Reported on 09/22/2023), Disp: 3350 g, Rfl: 1   predniSONE  (DELTASONE ) 20 MG tablet, Take 1 tablet (20 mg total) by mouth daily with breakfast. (Patient not taking: Reported on 07/16/2022), Disp: 5 tablet, Rfl: 0   terbinafine  (LAMISIL ) 250 MG tablet, Take 1 tablet (250 mg total) by mouth daily. (Patient not taking: Reported on 07/16/2022), Disp: 30 tablet, Rfl: 2   tiZANidine  (ZANAFLEX ) 4 MG tablet, Take 1 tablet (4 mg total) by mouth every 8 (eight) hours as needed. (Patient not taking: Reported on 07/16/2022), Disp: 60 tablet, Rfl: 1   Allergies  Allergen Reactions   Ibuprofen Swelling and Other (See Comments)    Lip swelling   Tamiflu [Oseltamivir Phosphate] Nausea And Vomiting    Past Medical History:  Diagnosis Date   Epididymal mass    left   Frequency of urination    GERD (gastroesophageal reflux disease)    History of exercise intolerance 10/2017   negative ETT, no ischemia   History of Helicobacter pylori infection 07/2017   Hypertension      Past Surgical History:  Procedure Laterality Date   CYSTOSCOPY N/A 11/24/2017   Procedure: FLEX CYSTOSCOPY;  Surgeon: Watt Rush, MD;  Location: [facility];  Service: Urology;  Laterality: N/A;   SCROTAL EXPLORATION Left 11/24/2017   Procedure: LEFT INGUINAL EXPLORATION WITH EXCISION OF  EPIDYIMAL MASS;  Surgeon: Watt Rush, MD;  Location: [facility];  Service: Urology;  Laterality: Left;    History reviewed. No pertinent family history.  Social History   Tobacco Use   Smoking status: Former    Current packs/day: 0.00    Types: Cigarettes    Start date: 02/16/1988    Quit date: 02/15/1998    Years since quitting: 25.6   Smokeless tobacco: Never  Vaping Use   Vaping status:  Never Used  Substance Use Topics   Alcohol use: No   Drug use: Not Currently    Types: Cocaine, Crack cocaine, Marijuana    Comment: 11-22-2017 per pt last cocaine 2001    ROS Refer to HPI for ROS details.  Objective:   Vitals: BP (!) 146/95   Pulse 77   Temp 98.3 F (36.8 C) (Oral)   Resp 16   SpO2 95%   Physical Exam Vitals and nursing note reviewed.  Constitutional:      General: He is not in acute distress.    Appearance: Normal appearance. He is well-developed. He is not ill-appearing or toxic-appearing.  HENT:     Head: Normocephalic.  Cardiovascular:     Rate and Rhythm: Normal rate and regular rhythm.     Heart sounds: Normal heart sounds. No murmur heard.    No gallop.  Pulmonary:     Effort: Pulmonary effort is normal. No respiratory distress.     Breath sounds: Normal breath sounds. No stridor. No wheezing, rhonchi or rales.  Chest:     Chest wall: No tenderness.  Skin:    General: Skin is warm and dry.  Neurological:     General: No focal deficit present.     Mental Status: He is alert and oriented to person, place, and time.  Psychiatric:        Mood and Affect: Mood normal.        Behavior: Behavior normal.     Procedures  No results found for this or any previous visit (from the past 24 hours).  No results found.   Assessment and Plan :     Discharge Instructions       1. Hypertension, unspecified type (Primary) - ED EKG completed in UC shows normal sinus rhythm with ventricular rate of 76 bpm, no STEMI, normal EKG. - Discussed with patient continuing to take medication as prescribed and check blood pressure after resting with feet flat on the floor, will arm elevated to the level of the heart and after sitting in this position for 5 minutes before taking blood pressure. - Advised to continue medication and follow-up with primary care provider as scheduled for follow-up evaluation of blood pressure readings. -Continue to monitor  symptoms for any change in severity if there is any escalation of current symptoms or development of new symptoms follow-up in ER for further evaluation and management.      Claud Gowan B Terence Bart   Sipriano Fendley, [place] B, [place] 10/14/23 1739

## 2023-10-14 NOTE — Telephone Encounter (Signed)
 Dispositiion noted with thanks

## 2023-10-14 NOTE — Telephone Encounter (Signed)
 Copied from CRM #[id number]. Topic: Clinical - Red Word Triage >> Oct 14, 2023 12:56 PM Tobias L wrote: Red Word that prompted transfer to Nurse Triage: checked BP earlier today and it was at 148/98  Patient also having ringing in his ear. Vision is blurry as well Reason for Disposition  [1] Systolic BP >= 160 OR Diastolic >= 100 AND [2] cardiac (e.g., breathing difficulty, chest pain) or neurologic symptoms (e.g., new-onset blurred or double vision, unsteady gait)    New onset of blurry vision 2 hours ago.   Ringing in right ear. Mild dizziness, feeling cold which is not normal for him.   Usually hot natured.  Answer Assessment - Initial Assessment Questions 1. BLOOD PRESSURE: What is your blood pressure? Did you take at least two measurements 5 minutes apart?     148/98 today   Vision blurry and having ringing in right ear.   Vision being blurry started 2 hours ago.   No headaches.   I still hear the ringing in my ear.   No recent illnesses. No history of strokes, heart problems.  Blurry in both eyes. I'm close to the ED now.   I'm at the eye clinic with my wife and we are close to the ED.   I'm going to go there when they finish with my wife here at the clinic.   I'll see if they will check my BP here too.   He was agreeable to going to the ED.   2. ONSET: When did you take your blood pressure?     148/98 was 20 min. Ago  I'm not home now.   I have a BP monitor at home too.   I used it.  3. HOW: How did you take your blood pressure? (e.g., automatic home BP monitor, visiting nurse)     My home monitor.    4. HISTORY: Do you have a history of high blood pressure?     Yes 5. MEDICINES: Are you taking any medicines for blood pressure? Have you missed any doses recently?     Yes   I took my medicine this morning for BP.   6. OTHER SYMPTOMS: Do you have any symptoms? (e.g., blurred vision, chest pain, difficulty breathing, headache, weakness)     Blurred vision both eyes, my hand was  cold and I felt cold.   That's not usual for me this morning.     I'm more hot natured.       No chest pain or tightness.   I'm having a little dizziness.  I speaking and talking now and I'm fine.   No shortness of breath.  I stopped eating salt.   Yesterday BP 122/88 approximately.   I'm a  little weak.   No hand shaking.    7. PREGNANCY: Is there any chance you are pregnant? When was your last menstrual period?     N/A  Protocols used: Blood Pressure - High-A-AH FYI Only or Action Required?: FYI only for provider.  Patient was last seen in primary care on 10/10/2023 by Delbert Clam, MD.  Called Nurse Triage reporting Hypertension.BP 148/98 but 2 hours ago vision became blurry both eyes, mild dizziness, and ringing in his ear.  Going to ED.  Symptoms began today.  Interventions attempted: Prescription medications: took his BP medicine this morning.  Symptoms are: rapidly worsening. Blurred vision, mild dizziness and ringing in his ear. No headaches or other symptoms.  Triage Disposition: Go to ED Now (Notify  PCP)  Patient/caregiver understands and will follow disposition?: Yes

## 2023-11-05 ENCOUNTER — Emergency Department (HOSPITAL_COMMUNITY): Payer: Self-pay

## 2023-11-05 ENCOUNTER — Emergency Department (HOSPITAL_COMMUNITY)
Admission: EM | Admit: 2023-11-05 | Discharge: 2023-11-05 | Disposition: A | Discharge status: Home / Self Care | Payer: Self-pay | Attending: Emergency Medicine | Admitting: Emergency Medicine

## 2023-11-05 ENCOUNTER — Encounter (HOSPITAL_COMMUNITY): Payer: Self-pay | Admitting: Emergency Medicine

## 2023-11-05 DIAGNOSIS — R072 Precordial pain: Secondary | ICD-10-CM | POA: Insufficient documentation

## 2023-11-05 DIAGNOSIS — Z79899 Other long term (current) drug therapy: Secondary | ICD-10-CM | POA: Insufficient documentation

## 2023-11-05 DIAGNOSIS — I10 Essential (primary) hypertension: Secondary | ICD-10-CM | POA: Insufficient documentation

## 2023-11-05 DIAGNOSIS — R0789 Other chest pain: Secondary | ICD-10-CM

## 2023-11-05 DIAGNOSIS — R1013 Epigastric pain: Secondary | ICD-10-CM | POA: Insufficient documentation

## 2023-11-05 DIAGNOSIS — R002 Palpitations: Secondary | ICD-10-CM | POA: Insufficient documentation

## 2023-11-05 LAB — BASIC METABOLIC PANEL WITH GFR
Anion gap: 9 (ref 5–15)
BUN: 10 mg/dL (ref 6–20)
CO2: 23 mmol/L (ref 22–32)
Calcium: 9.6 mg/dL (ref 8.9–10.3)
Chloride: 105 mmol/L (ref 98–111)
Creatinine, Ser: 0.95 mg/dL (ref 0.61–1.24)
GFR, Estimated: >60 mL/min
Glucose, Bld: 96 mg/dL (ref 70–99)
Potassium: 3.8 mmol/L (ref 3.5–5.1)
Sodium: 137 mmol/L (ref 135–145)

## 2023-11-05 LAB — LIPASE, BLOOD: Lipase: 30 U/L (ref 11–51)

## 2023-11-05 LAB — HEPATIC FUNCTION PANEL
ALT: 39 U/L (ref 0–44)
AST: 29 U/L (ref 15–41)
Albumin: 4 g/dL (ref 3.5–5.0)
Alkaline Phosphatase: 84 U/L (ref 38–126)
Bilirubin, Direct: 0.2 mg/dL (ref 0.0–0.2)
Indirect Bilirubin: 0.6 mg/dL (ref 0.3–0.9)
Total Bilirubin: 0.8 mg/dL (ref 0.0–1.2)
Total Protein: 7.9 g/dL (ref 6.5–8.1)

## 2023-11-05 LAB — CBC
HCT: 44.9 % (ref 39.0–52.0)
Hemoglobin: 15.9 g/dL (ref 13.0–17.0)
MCH: 30.4 pg (ref 26.0–34.0)
MCHC: 35.4 g/dL (ref 30.0–36.0)
MCV: 85.9 fL (ref 80.0–100.0)
Platelets: 272 K/uL (ref 150–400)
RBC: 5.23 MIL/uL (ref 4.22–5.81)
RDW: 13.1 % (ref 11.5–15.5)
WBC: 6.2 K/uL (ref 4.0–10.5)
nRBC: 0 % (ref 0.0–0.2)

## 2023-11-05 LAB — TROPONIN I (HIGH SENSITIVITY)
Troponin I (High Sensitivity): 3 ng/L (ref <18)
Troponin I (High Sensitivity): 3 ng/L

## 2023-11-05 LAB — D-DIMER, QUANTITATIVE: D-Dimer, Quant: <0.27 ug{FEU}/mL (ref 0.00–0.50)

## 2023-11-05 MED ORDER — OMEPRAZOLE 20 MG PO CPDR
20.0000 mg | DELAYED_RELEASE_CAPSULE | Freq: Every day | ORAL | 0 refills | Status: DC
Start: 2023-11-05 — End: 2023-11-25

## 2023-11-05 NOTE — ED Notes (Signed)
 Pt states he notices when he eats pork or red meat that his symptoms starts. Pt describe acid reflux and tightness in his stomach.   RN discussed eating lean meats and cutting out pork.   Pt states after he eats these things 4 hours can pass when he lays down its uncomfortable.

## 2023-11-05 NOTE — ED Triage Notes (Signed)
 Pt reports multiple episodes of CP with near syncopal, SOB and dizziness. States he has had EKGs done but never labs. Tightness of 5/10 now.

## 2023-11-05 NOTE — ED Triage Notes (Signed)
 C/o chest pain and left arm pain onset last pm , states it felt better this am, howvever he was  playing bass guitar at church and it started hurting again

## 2023-11-05 NOTE — ED Provider Notes (Signed)
 [place] EMERGENCY DEPARTMENT AT [facility] Provider Note   CSN: [id number] Arrival date & time: 11/05/23  1345     Patient presents with: Chest Pain   Darren Burns is a 48 y.o. male.   The history is provided by the patient and medical records. No language interpreter was used.  Chest Pain Pain location:  Substernal area Pain quality: aching, dull and radiating   Pain radiates to:  L arm Pain severity:  Moderate Timing:  Sporadic Progression:  Waxing and waning Chronicity:  Recurrent Relieved by:  Nothing Worsened by:  Nothing Ineffective treatments:  None tried Associated symptoms: abdominal pain (mild), fatigue, heartburn, palpitations and shortness of breath   Associated symptoms: no altered mental status, no back pain, no cough, no diaphoresis, no fever, no headache, no lower extremity edema, no nausea, no numbness, no vomiting and no weakness   Risk factors: hypertension        Prior to Admission medications   Medication Sig Start Date End Date Taking? Authorizing Provider  acetaminophen  (TYLENOL ) 500 MG tablet Take 1,000 mg by mouth every 6 (six) hours as needed for moderate pain or headache. Patient not taking: Reported on 09/22/2023    [provider]  albuterol  (PROVENTIL  HFA) 108 (90 Base) MCG/ACT inhaler Inhale 2 puffs into the lungs every 6 (six) hours as needed for wheezing or shortness of breath. Patient not taking: Reported on 09/22/2023 03/19/22   [name], Enobong, MD  amLODipine  (NORVASC ) 2.5 MG tablet Take 1 tablet (2.5 mg total) by mouth daily. 10/10/23   [name], Enobong, MD  cetirizine  (ZYRTEC ) 10 MG tablet Take 1 tablet (10 mg total) by mouth daily. Patient not taking: No sig reported 03/19/22   Delbert Clam, MD  diclofenac  Sodium (VOLTAREN ) 1 % GEL Apply 4 g topically 4 (four) times daily. 09/22/23   [name], Enobong, MD  doxycycline  (VIBRAMYCIN ) 100 MG capsule Take 1 capsule (100 mg total) by mouth 2 (two) times  daily. Patient not taking: Reported on 07/16/2022 05/12/22   Prosperi, Christian H, PA-C  hydrOXYzine  (ATARAX /VISTARIL ) 25 MG tablet Take 1 tablet (25 mg total) by mouth at bedtime as needed. Patient not taking: Reported on 07/16/2022 12/09/20   [name], Enobong, MD  omeprazole  (PRILOSEC) 20 MG capsule Take 1 capsule (20 mg total) by mouth daily. Patient not taking: No sig reported 07/16/22   [name], Enobong, MD  polyethylene glycol powder (GLYCOLAX /MIRALAX ) powder Take 17 g by mouth daily. Patient not taking: Reported on 09/22/2023 11/26/16   [name], Enobong, MD  predniSONE  (DELTASONE ) 20 MG tablet Take 1 tablet (20 mg total) by mouth daily with breakfast. Patient not taking: Reported on 07/16/2022 03/19/22   [name], Enobong, MD  terbinafine  (LAMISIL ) 250 MG tablet Take 1 tablet (250 mg total) by mouth daily. Patient not taking: Reported on 07/16/2022 03/19/22   [name], Enobong, MD  tiZANidine  (ZANAFLEX ) 4 MG tablet Take 1 tablet (4 mg total) by mouth every 8 (eight) hours as needed. Patient not taking: Reported on 07/16/2022 03/19/22   [name], Enobong, MD    Allergies: Ibuprofen and Tamiflu [oseltamivir phosphate]    Review of Systems  Constitutional:  Positive for fatigue. Negative for diaphoresis and fever.  HENT:  Negative for congestion.   Respiratory:  Positive for chest tightness and shortness of breath. Negative for cough.   Cardiovascular:  Positive for chest pain and palpitations.  Gastrointestinal:  Positive for abdominal pain (mild) and heartburn. Negative for constipation, diarrhea, nausea and vomiting.  Genitourinary:  Negative for dysuria  and flank pain.  Musculoskeletal:  Negative for back pain, neck pain and neck stiffness.  Skin:  Negative for rash.  Neurological:  Negative for weakness, light-headedness, numbness and headaches.  Psychiatric/Behavioral:  Negative for agitation.   All other systems reviewed and are negative.   Updated Vital Signs BP (!) 148/103 (BP Location: Right  Arm)   Pulse 91   Temp 98.3 F (36.8 C)   Resp 18   Ht 5' 5 (1.651 m)   Wt 88.5 kg   SpO2 97%   BMI 32.45 kg/m   Physical Exam Vitals and nursing note reviewed.  Constitutional:      General: He is not in acute distress.    Appearance: He is well-developed. He is not ill-appearing, toxic-appearing or diaphoretic.  HENT:     Head: Normocephalic and atraumatic.  Eyes:     Conjunctiva/sclera: Conjunctivae normal.  Cardiovascular:     Rate and Rhythm: Normal rate and regular rhythm.     Heart sounds: Normal heart sounds. No murmur heard. Pulmonary:     Effort: Pulmonary effort is normal. No respiratory distress.     Breath sounds: Normal breath sounds. No wheezing, rhonchi or rales.  Chest:     Chest wall: No tenderness.  Abdominal:     General: Abdomen is flat. Bowel sounds are normal.     Palpations: Abdomen is soft.     Tenderness: There is abdominal tenderness in the epigastric area.      Comments: Very mild possible discomfort in the epigastric area with palpation.  No significant tenderness found.  Musculoskeletal:        General: No swelling.     Cervical back: Neck supple.  Skin:    General: Skin is warm and dry.     Capillary Refill: Capillary refill takes less than 2 seconds.  Neurological:     Mental Status: He is alert.  Psychiatric:        Mood and Affect: Mood normal.     (all labs ordered are listed, but only abnormal results are displayed) Labs Reviewed  BASIC METABOLIC PANEL WITH GFR  CBC  LIPASE, BLOOD  HEPATIC FUNCTION PANEL  D-DIMER, QUANTITATIVE  TROPONIN I (HIGH SENSITIVITY)  TROPONIN I (HIGH SENSITIVITY)    EKG: EKG Interpretation Date/Time:  Saturday November 05 2023 13:53:12 EDT Ventricular Rate:  85 PR Interval:  150 QRS Duration:  84 QT Interval:  340 QTC Calculation: 404 R Axis:   64  Text Interpretation: Normal sinus rhythm Normal ECG When compared with ECG of 14-Oct-2023 17:17, PREVIOUS ECG IS PRESENT when compared to  prior, similar appearance No STEMI Confirmed by Ginger Barefoot ([id number]) on 11/05/2023 3:34:54 PM  Radiology: DG Chest 2 View Result Date: 11/05/2023 EXAM: 2 VIEW(S) XRAY OF THE CHEST 11/05/2023 02:41:00 PM COMPARISON: 05/12/2022 CLINICAL HISTORY: CP, sob. Chest pain, sob; no hx of heart or lung disease FINDINGS: LUNGS AND PLEURA: No focal pulmonary opacity. No pulmonary edema. No pleural effusion. No pneumothorax. HEART AND MEDIASTINUM: No acute abnormality of the cardiac and mediastinal silhouettes. BONES AND SOFT TISSUES: No acute osseous abnormality. IMPRESSION: 1. No acute cardiopulmonary process. Electronically signed by: Waddell Calk MD 11/05/2023 03:04 PM EDT RP Workstation: [id number]     Procedures   Medications Ordered in the ED - No data to display                                  Medical  Decision Making Amount and/or Complexity of Data Reviewed Labs: ordered. Radiology: ordered.    Darren Burns is a 48 y.o. male with a past medical history significant for hypertension and GERD who presents with recurrent chest discomfort.  According to patient, he woke up at 3 AM with discomfort in his chest that felt like a tightness and pressure.  It was also in his upper abdomen.  He reports no nausea or vomiting and did not have diaphoresis but did have the shortness of breath and tightness.  He reports he did go down his left arm and he was lightheaded.  He is feeling better.  He says that his watch told him his heart rate was going in the 130s but did not say an arrhythmia.  He has had this before and attributed to heartburn.  He does report he had a pork chop yesterday and reported the last time he had chest pain it was related to pork intake.  He force no trauma.  Denies rash.  Denies any constipation, diarrhea, or urinary changes.  Denies history of blood clots and has no leg pain or leg swelling.  Reports that the pain was moderate to severe and has resolved now.  On exam,  lungs clear.  Chest is nontender.  Epigastric area slightly tender to palpation but I did hear good bowel sounds.  Intact pulses.  Legs nontender and nonedematous.  Patient otherwise well-appearing.  Clinically I suspect he may have a GI etiology given his history of the same and his food intake last night that is provoked in the past.  He is not having symptoms now so we will hold on new medication but we will get workup.  With his discomfort with deep breathing and heart rate around 100 when I saw him and age of 48, we will get a D-dimer to rule out a thromboembolic etiology.  Delta troponin was negative and EKG did not show STEMI.  Chest x-ray reassuring.  If upper abdominal labs and D-dimer are negative, anticipate discharge home with prescription for Prilosec and instructions to follow-up with outpatient team.  With the heart rate going in the 130s intermittently with his symptoms, he may also need to follow-up with cardiology.  Anticipate reassessment after workup completion.  10:13 PM Patient's workup returned overall reassuring.  Troponin negative x 2.  D-dimer negative.  Chest x-ray reassuring.  As we are ruling out concerning etiologies I do suspect may related to the pork that may have upset his stomach as it has in the past.  He will get prescription for Prilosec which he has not been taking recently and follow-up with GI and with his recent palpitations and fast heart rate overnight he can follow-up with cardiology as well.  Patient agrees with plan of care and cerumen stability for over 8 hours.  He would like to go home.  Patient will follow-up and understood return precautions.  Patient discharged in good condition.      Final diagnoses:  Atypical chest pain  Palpitations    ED Discharge Orders          Ordered    omeprazole  (PRILOSEC) 20 MG capsule  Daily        11/05/23 2211            Clinical Impression: 1. Atypical chest pain   2. Palpitations      Disposition: Discharge  Condition: Good  I have discussed the results, Dx and Tx plan with the pt(& family if present).  He/she/they expressed understanding and agree(s) with the plan. Discharge instructions discussed at great length. Strict return precautions discussed and pt &/or family have verbalized understanding of the instructions. No further questions at time of discharge.    New Prescriptions   OMEPRAZOLE  (PRILOSEC) 20 MG CAPSULE    Take 1 capsule (20 mg total) by mouth daily.    Follow Up: Delbert Clam, MD [address] [place] [address] [phone]     Gastroenterology, Margarete [address] [place] [address] [place] [address] [phone]     [facility] A DEPT OF [place]. [facility] [address] [place]  [address] [phone]       Jessenia Filippone, Lonni PARAS, MD 11/05/23 2214

## 2023-11-05 NOTE — Discharge Instructions (Signed)
 Your history, exam, workup today seem consistent with palpitations and atypical chest discomfort that may be related to a GI cause.  Your workup was reassuring with no evidence of blood clot, reassuring chest x-ray, and your heart enzymes were negative both times we checked them.  Your lipase for your pancreas and your liver function was normal.  I suspect your food intake may have provoked the discomfort similar to what it has in the past.  Please start taking the Prilosec and rest and stay hydrated.  Given the palpitations and fast heart rate intermittently, please consider follow-up with outpatient cardiology as well.  If any symptoms change or worsen acutely, please return to the nearest emergency department.

## 2023-11-23 ENCOUNTER — Ambulatory Visit: Payer: Self-pay

## 2023-11-23 NOTE — Telephone Encounter (Signed)
 Noted. Patient has upcoming appointment 11/25/23.

## 2023-11-23 NOTE — Telephone Encounter (Signed)
 FYI Only or Action Required?: FYI only for provider.  Patient was last seen in primary care on 10/10/2023 by [name], Enobong, MD.  Called Nurse Triage reporting Dizziness.  Symptoms began about a month ago.  Interventions attempted: Nothing.  Symptoms are: unchanged.  Triage Disposition: See PCP When Office is Open (Within 3 Days)  Patient/caregiver understands and will follow disposition?: Yes        Copied from CRM #[id number]. Topic: Clinical - Red Word Triage >> Nov 23, 2023 12:20 PM Fonda T wrote: [facility] that prompted transfer to Nurse Triage: Received call from patient, reports he is having episodes of dizziness, feeling faint, fatigued and some chest pressure. Reason for Disposition  [1] MODERATE dizziness (e.g., interferes with normal activities) AND [2] has been evaluated by doctor (or NP/PA) for this  Answer Assessment - Initial Assessment Questions PT states he was in the ER last month and everything was good. He thinks it's an allergic reaction to food like pork or fried chicken. He states he took some allergy medicine and that helped.   He also asked if it could be anxiety related. States he has no symptoms when he exercises.   1. DESCRIPTION: Describe your dizziness.     Feels something in his chest when he goes from sitting to standing, then feels like he can't breath, then tries to catch breath, then feels like he is going to pass out.  2. LIGHTHEADED: Do you feel lightheaded? (e.g., somewhat faint, woozy, weak upon standing)     Yes with standing 3. VERTIGO: Do you feel like either you or the room is spinning or tilting? (i.e., vertigo)     no 4. SEVERITY: How bad is it?  Do you feel like you are going to faint? Can you stand and walk?     Ok currently 5. ONSET:  When did the dizziness begin?      First started about 1 month ago 6. AGGRAVATING FACTORS: Does anything make it worse? (e.g., standing, change in head position)     He believes it is  either allergies to food or anxiety/panic attach  8. CAUSE: What do you think is causing the dizziness? (e.g., decreased fluids or food, diarrhea, emotional distress, heat exposure, new medicine, sudden standing, vomiting; unknown)     Thinks it's after eating pork 9. RECURRENT SYMPTOM: Have you had dizziness before? If Yes, ask: When was the last time? What happened that time?     Yes after eating pork 10. OTHER SYMPTOMS: Do you have any other symptoms? (e.g., fever, chest pain, vomiting, diarrhea, bleeding)       Feels like he's going to pass out.  Protocols used: Dizziness - Lightheadedness-A-AH

## 2023-11-25 ENCOUNTER — Other Ambulatory Visit: Payer: Self-pay | Admitting: Family Medicine

## 2023-11-25 ENCOUNTER — Encounter: Payer: Self-pay | Admitting: Nurse Practitioner

## 2023-11-25 ENCOUNTER — Ambulatory Visit: Payer: Self-pay | Attending: Nurse Practitioner

## 2023-11-25 ENCOUNTER — Other Ambulatory Visit: Payer: Self-pay

## 2023-11-25 ENCOUNTER — Ambulatory Visit (INDEPENDENT_AMBULATORY_CARE_PROVIDER_SITE_OTHER): Payer: Self-pay | Admitting: Nurse Practitioner

## 2023-11-25 VITALS — BP 125/78 | HR 67 | Temp 97.0°F | Wt 197.0 lb

## 2023-11-25 DIAGNOSIS — T7840XA Allergy, unspecified, initial encounter: Secondary | ICD-10-CM | POA: Insufficient documentation

## 2023-11-25 DIAGNOSIS — K219 Gastro-esophageal reflux disease without esophagitis: Secondary | ICD-10-CM

## 2023-11-25 DIAGNOSIS — Z09 Encounter for follow-up examination after completed treatment for conditions other than malignant neoplasm: Secondary | ICD-10-CM

## 2023-11-25 DIAGNOSIS — Z23 Encounter for immunization: Secondary | ICD-10-CM

## 2023-11-25 DIAGNOSIS — R1013 Epigastric pain: Secondary | ICD-10-CM

## 2023-11-25 DIAGNOSIS — T7840XD Allergy, unspecified, subsequent encounter: Secondary | ICD-10-CM

## 2023-11-25 DIAGNOSIS — R0789 Other chest pain: Secondary | ICD-10-CM

## 2023-11-25 DIAGNOSIS — I10 Essential (primary) hypertension: Secondary | ICD-10-CM

## 2023-11-25 DIAGNOSIS — R0602 Shortness of breath: Secondary | ICD-10-CM

## 2023-11-25 MED ORDER — OMEPRAZOLE 20 MG PO CPDR
20.0000 mg | DELAYED_RELEASE_CAPSULE | Freq: Every day | ORAL | 0 refills | Status: AC
  Filled 2023-11-25 – 2024-01-18 (×2): qty 30, 30d supply, fill #0

## 2023-11-25 MED ORDER — ALBUTEROL SULFATE HFA 108 (90 BASE) MCG/ACT IN AERS
2.0000 | INHALATION_SPRAY | Freq: Four times a day (QID) | RESPIRATORY_TRACT | 3 refills | Status: DC | PRN
  Filled 2023-11-25: qty 6.7, 25d supply, fill #0

## 2023-11-25 NOTE — Patient Instructions (Signed)
 1. Shortness of breath (Primary)  - albuterol  (PROVENTIL  HFA) 108 (90 Base) MCG/ACT inhaler; Inhale 2 puffs into the lungs every 6 (six) hours as needed for wheezing or shortness of breath.  Dispense: 6.7 g; Refill: 3  2. Atypical chest pain  - LONG TERM MONITOR (3-14 DAYS); Future - Ambulatory referral to Cardiology  3. Epigastric abdominal pain   4. Primary hypertension   5. Allergy, subsequent encounter   It is important that you exercise regularly at least 30 minutes 5 times a week as tolerated  Think about what you will eat, plan ahead. Choose  clean, green, fresh or frozen over canned, processed or packaged foods which are more sugary, salty and fatty. 70 to 75% of food eaten should be vegetables and fruit. Three meals at set times with snacks allowed between meals, but they must be fruit or vegetables. Aim to eat over a 12 hour period , example 7 am to 7 pm, and STOP after  your last meal of the day. Drink water ,generally about 64 ounces per day, no other drink is as healthy. Fruit juice is best enjoyed in a healthy way, by EATING the fruit.  Thanks for choosing [facility] we consider it a privelige to serve you.

## 2023-11-25 NOTE — Assessment & Plan Note (Signed)
 Hospital chart reviewed, including discharge summary Medications reconciled and reviewed with the patient in detail

## 2023-11-25 NOTE — Progress Notes (Unsigned)
 EP to read.

## 2023-11-25 NOTE — Progress Notes (Signed)
 Established Patient Office Visit  Subjective:  Patient ID: Krrish Freund, male    DOB: 02-04-1976  Age: 48 y.o. MRN: [id number]  CC:  Chief Complaint  Patient presents with   Fatigue    With faint feeling for about a month    HPI   Discussed the use of AI scribe software for clinical note transcription with the patient, who gave verbal consent to proceed.  History of Present Illness Jaythen Hamme is a 48 year old male who presents with ongoing chest pain.  He has been experiencing sharp, intermittent chest pain on the left side since his emergency room visit on September 20th. Episodes last a few seconds and are followed by a sensation of tightness and impending faintness. The pain sometimes occurs with activities like singing and dancing at church, accompanied by a sensation of his head feeling 'frown' and a feeling of passing out. Deep breathing exercises have not provided relief.  He underwent an evaluation in the emergency room, including an EKG, which was normal, and blood tests, including troponin and D-dimer, which were negative. A chest x-ray was also normal. Despite these findings, he continues to experience symptoms.  He experiences abdominal discomfort associated with the consumption of pork and other fatty foods, describing a sensation of tightness and bloating after eating foods like carnitas or chips.  He has a history of asthma and uses albuterol  as needed, particularly during the fall season when symptoms worsen. He also experiences mild allergies and takes cetirizine  as needed for symptoms like sneezing and a runny nose.  He monitors his blood pressure at home and reports a reading of 140 mmHg after consuming fried foods. He is currently taking amlodipine  2.5 mg daily for blood pressure management.     Assessment & Plan Recurrent left-sided chest pain Intermittent sharp left-sided chest pain, non-cardiac causes suspected after normal  cardiac tests. Symptoms affect lifestyle. - Refer to cardiologist for further evaluation. - Order zio monitor for home use.   Epi gastric abdominal Pain  Gastroesophageal reflux symptoms with abdominal bloating Abdominal bloating and tightness postprandial, suggestive of acid reflux. Omeprazole  prescribed but not started. - Advise avoiding pork, fatty foods, fried foods, spicy foods, and caffeinated drinks. - Advise meals 2-3 hours before bedtime. - Encourage taking omeprazole  20mg  daily as prescribed.  Hypertension Blood pressure at 140 mmHg post high-salt intake. Advised heart-healthy diet and exercise. - Continue amlodipine  2.5 mg daily. - Advise low-salt, low-fat diet. - Encourage regular physical activity, 5 days a week.  Shortness of breath  Occasional tightness and dyspnea, especially in fall. Out of albuterol .  Albuterol  inhaler refilled  Allergies Mild seasonal allergies with sneezing and rhinorrhea, especially in fall. - Advise taking cetirizine  as needed.       Past Medical History:  Diagnosis Date   Epididymal mass    left   Frequency of urination    GERD (gastroesophageal reflux disease)    History of exercise intolerance 10/2017   negative ETT, no ischemia   History of Helicobacter pylori infection 07/2017   Hypertension     Past Surgical History:  Procedure Laterality Date   CYSTOSCOPY N/A 11/24/2017   Procedure: FLEX CYSTOSCOPY;  Surgeon: Watt Rush, MD;  Location: [facility];  Service: Urology;  Laterality: N/A;   SCROTAL EXPLORATION Left 11/24/2017   Procedure: LEFT INGUINAL EXPLORATION WITH EXCISION OF EPIDYIMAL MASS;  Surgeon: Watt Rush, MD;  Location: [facility];  Service: Urology;  Laterality: Left;    History reviewed. No pertinent family  history.  Social History   Socioeconomic History   Marital status: Married    Spouse name: Not on file   Number of children: Not on file   Years of education: Not on file   Highest education level: Not on  file  Occupational History   Not on file  Tobacco Use   Smoking status: Former    Current packs/day: 0.00    Types: Cigarettes    Start date: 02/16/1988    Quit date: 02/15/1998    Years since quitting: 25.7   Smokeless tobacco: Never  Vaping Use   Vaping status: Never Used  Substance and Sexual Activity   Alcohol use: No   Drug use: Not Currently    Types: Cocaine, Crack cocaine, Marijuana    Comment: 11-22-2017 per pt last cocaine 2001   Sexual activity: Not on file  Other Topics Concern   Not on file  Social History Narrative   Not on file   Social Drivers of Health   Financial Resource Strain: Not on file  Food Insecurity: Not on file  Transportation Needs: Not on file  Physical Activity: Not on file  Stress: Not on file  Social Connections: Not on file  Intimate Partner Violence: Not on file    Outpatient Medications Prior to Visit  Medication Sig Dispense Refill   acetaminophen  (TYLENOL ) 500 MG tablet Take 1,000 mg by mouth every 6 (six) hours as needed for moderate pain or headache.     amLODipine  (NORVASC ) 2.5 MG tablet Take 1 tablet (2.5 mg total) by mouth daily. 90 tablet 1   diclofenac  Sodium (VOLTAREN ) 1 % GEL Apply 4 g topically 4 (four) times daily. 100 g 1   doxycycline  (VIBRAMYCIN ) 100 MG capsule Take 1 capsule (100 mg total) by mouth 2 (two) times daily. 20 capsule 0   omeprazole  (PRILOSEC) 20 MG capsule Take 1 capsule (20 mg total) by mouth daily. 30 capsule 0   cetirizine  (ZYRTEC ) 10 MG tablet Take 1 tablet (10 mg total) by mouth daily. (Patient not taking: Reported on 11/25/2023) 30 tablet 1   hydrOXYzine  (ATARAX /VISTARIL ) 25 MG tablet Take 1 tablet (25 mg total) by mouth at bedtime as needed. (Patient not taking: Reported on 11/25/2023) 30 tablet 2   polyethylene glycol powder (GLYCOLAX /MIRALAX ) powder Take 17 g by mouth daily. (Patient not taking: Reported on 11/25/2023) 3350 g 1   predniSONE  (DELTASONE ) 20 MG tablet Take 1 tablet (20 mg total) by  mouth daily with breakfast. (Patient not taking: Reported on 11/25/2023) 5 tablet 0   terbinafine  (LAMISIL ) 250 MG tablet Take 1 tablet (250 mg total) by mouth daily. (Patient not taking: Reported on 11/25/2023) 30 tablet 2   tiZANidine  (ZANAFLEX ) 4 MG tablet Take 1 tablet (4 mg total) by mouth every 8 (eight) hours as needed. (Patient not taking: Reported on 11/25/2023) 60 tablet 1   albuterol  (PROVENTIL  HFA) 108 (90 Base) MCG/ACT inhaler Inhale 2 puffs into the lungs every 6 (six) hours as needed for wheezing or shortness of breath. (Patient not taking: Reported on 11/25/2023) 6.7 g 3   omeprazole  (PRILOSEC) 20 MG capsule Take 1 capsule (20 mg total) by mouth daily. (Patient not taking: Reported on 11/25/2023) 30 capsule 3   No facility-administered medications prior to visit.    Allergies  Allergen Reactions   Ibuprofen Swelling and Other (See Comments)    Lip swelling   Tamiflu [Oseltamivir Phosphate] Nausea And Vomiting    ROS Review of Systems  Constitutional:  Negative for  appetite change, chills, fatigue and fever.  HENT:  Negative for congestion, postnasal drip, rhinorrhea and sneezing.   Respiratory:  Negative for cough, shortness of breath and wheezing.   Cardiovascular:  Positive for chest pain. Negative for palpitations and leg swelling.  Gastrointestinal:  Positive for abdominal pain. Negative for constipation, nausea and vomiting.  Genitourinary:  Negative for difficulty urinating, dysuria, flank pain and frequency.  Musculoskeletal:  Negative for arthralgias, back pain, joint swelling and myalgias.  Skin:  Negative for color change, pallor, rash and wound.  Neurological:  Negative for dizziness, facial asymmetry, weakness, numbness and headaches.  Psychiatric/Behavioral:  Negative for behavioral problems, confusion, self-injury and suicidal ideas.       Objective:    Physical Exam Vitals and nursing note reviewed.  Constitutional:      General: He is not in acute  distress.    Appearance: Normal appearance. He is obese. He is not ill-appearing, toxic-appearing or diaphoretic.  Eyes:     General: No scleral icterus.       Right eye: No discharge.        Left eye: No discharge.     Extraocular Movements: Extraocular movements intact.     Conjunctiva/sclera: Conjunctivae normal.  Cardiovascular:     Rate and Rhythm: Normal rate and regular rhythm.     Pulses: Normal pulses.     Heart sounds: Normal heart sounds. No murmur heard.    No friction rub. No gallop.  Pulmonary:     Effort: Pulmonary effort is normal. No respiratory distress.     Breath sounds: Normal breath sounds. No stridor. No wheezing, rhonchi or rales.  Chest:     Chest wall: No tenderness.  Abdominal:     General: There is no distension.     Palpations: Abdomen is soft.     Tenderness: There is abdominal tenderness. There is no right CVA tenderness, left CVA tenderness or guarding.     Comments: Tenderness of epigastric area on palpation  Musculoskeletal:        General: No swelling, tenderness, deformity or signs of injury.     Right lower leg: No edema.     Left lower leg: No edema.  Skin:    General: Skin is warm and dry.     Capillary Refill: Capillary refill takes less than 2 seconds.     Coloration: Skin is not jaundiced or pale.     Findings: No bruising, erythema or lesion.  Neurological:     Mental Status: He is alert and oriented to person, place, and time.     Motor: No weakness.     Coordination: Coordination normal.     Gait: Gait normal.  Psychiatric:        Mood and Affect: Mood normal.        Behavior: Behavior normal.        Thought Content: Thought content normal.        Judgment: Judgment normal.     BP 125/78   Pulse 67   Temp (!) 97 F (36.1 C)   Wt 197 lb (89.4 kg)   SpO2 100%   BMI 32.78 kg/m  Wt Readings from Last 3 Encounters:  11/25/23 197 lb (89.4 kg)  11/05/23 195 lb (88.5 kg)  10/10/23 200 lb 3.2 oz (90.8 kg)    Lab Results   Component Value Date   TSH 1.920 12/09/2020   Lab Results  Component Value Date   WBC 6.2 11/05/2023   HGB 15.9  11/05/2023   HCT 44.9 11/05/2023   MCV 85.9 11/05/2023   PLT 272 11/05/2023   Lab Results  Component Value Date   NA 137 11/05/2023   K 3.8 11/05/2023   CO2 23 11/05/2023   GLUCOSE 96 11/05/2023   BUN 10 11/05/2023   CREATININE 0.95 11/05/2023   BILITOT 0.8 11/05/2023   ALKPHOS 84 11/05/2023   AST 29 11/05/2023   ALT 39 11/05/2023   PROT 7.9 11/05/2023   ALBUMIN 4.0 11/05/2023   CALCIUM 9.6 11/05/2023   ANIONGAP 9 11/05/2023   EGFR 105 10/10/2023   Lab Results  Component Value Date   CHOL 182 10/10/2023   Lab Results  Component Value Date   HDL 42 10/10/2023   Lab Results  Component Value Date   LDLCALC 120 (H) 10/10/2023   Lab Results  Component Value Date   TRIG 110 10/10/2023   Lab Results  Component Value Date   CHOLHDL 4.4 04/26/2019   Lab Results  Component Value Date   HGBA1C 5.5 10/10/2023      Assessment & Plan:   Problem List Items Addressed This Visit       Cardiovascular and Mediastinum   Primary hypertension     Other   Atypical chest pain - Primary   Relevant Orders   LONG TERM MONITOR (3-14 DAYS)   Ambulatory referral to Cardiology   Shortness of breath   Relevant Medications   albuterol  (PROVENTIL  HFA) 108 (90 Base) MCG/ACT inhaler   Epigastric abdominal pain   Allergies   Encounter for examination following treatment at hospital   Hospital chart reviewed, including discharge summary Medications reconciled and reviewed with the patient in detail       Need for influenza vaccination   Relevant Orders   Flu vaccine trivalent PF, 6mos and older(Flulaval,Afluria,Fluarix,Fluzone)    Meds ordered this encounter  Medications   albuterol  (PROVENTIL  HFA) 108 (90 Base) MCG/ACT inhaler    Sig: Inhale 2 puffs into the lungs every 6 (six) hours as needed for wheezing or shortness of breath.    Dispense:  6.7 g     Refill:  3    Follow-up: No follow-ups on file.    Laurita Peron R Remmington Urieta, FNP

## 2023-12-06 ENCOUNTER — Other Ambulatory Visit: Payer: Self-pay

## 2023-12-19 NOTE — Progress Notes (Signed)
 [facility]:    Date:  12/29/2023   ID:  Darren Burns, DOB 06-Jul-1975, MRN [id number]  PCP:  Delbert Clam, MD  Cardiologist:  Shelda Bruckner, MD     Referring MD: Paseda, Folashade R, FNP   Chief Complaint: chest pain  History of Present Illness:    Darren Burns is a 47 y.o. male with a history of atypical chest pain with a negative ETT in 10/2017, hypertension, dyslipidemia, asthma, GERD, tinnitus, vertigo, insomnia, and prior tobacco abuse who was previously seen by Dr. Bruckner and now presents as a new patient in the [facility] for further evaluation of chest pain.  Patient was admitted in 10/2014 for atypical chest pain and lower abdominal pain. EKG showed no acute ischemic changes and troponin was negative. Echo showed LVEF of 60-65% with grade 1 diastolic dysfunction. He was not seen by Cardiology at that time. He was later referred to Dr. Bruckner in 10/2017 for atypical chest pain. ETT was ordered and showed no evidence of ischemia. He has not been seen by Cardiology since that time.   He was recently seen in the ED in 10/2023 for chest pain/ epigastric pain with radiation to left arm and associated shortness of breath. He also reported some palpitations/ tachycardia. EKG showed normal sinus rhythm with no acute ischemic changes. High-sensitivity troponin negative x2. D-dimer negative. Lipase and LFTs were normal. Epigastric area was tender to palpation on exam. Pain was felt to likely be GI in nature. He was started on Omeprazole  and advised to follow-up with GI. He subsequently saw his PCP in early 11/2023. Zio monitor was ordered for further evaluation of palpitations and he was referred to Cardiology for further evaluation of chest pain.   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  While singing/dancing, he developed sudden onset of abdominal/chest  pressure with associated numbness in his face, sweating of his hands, and near syncope.  He states he felt like he was going to have a heart attack.  EMS was reportedly called and he states his BP was a little high with systolic BP in the 150s but he did not go to the ED at that time.  He subsequently had 4 other similar episodes to this some of which occurred at rest.  He ultimately went to the ED in 10/2023 for further evaluation.  He was started on Omeprazole  at that time and has had significant improvement in symptoms.  He describes a couple recurrent mild episodes of chest pressure but nothing as significant as before.  Looking back, he thinks several episodes occurred after eating pork/ sausage.  He states that when he lays down, he gets an uncomfortable feeling in his abdomen which makes it hard for him to breathe.  This typically last a couple hours and then he is able to fall asleep.  This has been going on for a while.  He denies any other shortness of breath.  No exertional dyspnea.  He uses a Environmental education officer and is able to go 20 minutes on this without any chest pain or shortness of breath.  He also reports intermittent palpitations that he describes as heart racing.  This occurs a couple of times a week and lasts for 1-2 minutes at a time.  His PCP ordered a ZIO monitor which he finished wearing yesterday and mailed back in.  I am not able to see any preliminary results yet.  He denies any episodes of dizziness outside the episodes of more intense abdominal/chest pressure.  No syncope.  He also describes tinnitus in his right ear since 2009.  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  He now works as a emergency planning/management officer.  He has a history of tobacco and alcohol use but quit both over 20 years ago.  His mother had a history of stroke in her 61s but no other family history of cardiovascular disease.  EKGs/Labs/Other Studies Reviewed:    The following  studies were reviewed:  Echocardiogram 11/10/2014: Study Conclusions: - Left ventricle: The cavity size was normal. Wall thickness was    normal. Systolic function was normal. The estimated ejection    fraction was in the range of 60% to 65%. Wall motion was normal;    there were no regional wall motion abnormalities. Doppler    parameters are consistent with abnormal left ventricular    relaxation (grade 1 diastolic dysfunction). The E/e&' ratio is    between 8-15, suggesting indeterminate LV filling pressure.  - Mitral valve: Mildly thickened leaflets . There was trivial    regurgitation.  - Left atrium: The atrium was normal in size.  - Right atrium: The atrium was mildly dilated.  - Tricuspid valve: There was trivial regurgitation.  - Pulmonary arteries: PA peak pressure: 13 mm Hg (S).  - Inferior vena cava: The vessel was normal in size. The    respirophasic diameter changes were in the normal range (>= 50%),    consistent with normal central venous pressure.   Impressions:  - LVEF 60-65%, normal wall thickness, normal wall motion, diastolic    dysfunction, indeterminate LV filling pressure, trivial MR, TR,    mildly dilated RA, normal RVSP.  _______________  Exercise Tolerance Test 11/04/2017: Patient walked on a Bruce protocol treadmill test for 11 minutes. He achieved a peak heart rate of 162 which is 92% predicted maximal heart rate. There were no ST or T wave changes at peak exercise. The blood pressure response to exercise was normal. This is interpreted as a negative exercise test. There is no evidence of ischemia. There was no QRS widening with exercise. _______________   EKG:  EKG ordered today.   EKG Interpretation Date/Time:  Thursday December 29 2023 08:19:45 EST Ventricular Rate:  65 PR Interval:  170 QRS Duration:  72 QT Interval:  394 QTC Calculation: 409 R Axis:   14  Text Interpretation: Normal sinus rhythm  Early repolarization changes When compared  with ECG of 05-Nov-2023 13:53, No significant change was found Confirmed by Jadine Patient [phone]) on 12/29/2023 12:18:02 PM    Recent Labs: 11/05/2023: ALT 39; BUN 10; Creatinine, Ser 0.95; Hemoglobin 15.9; Platelets 272; Potassium 3.8; Sodium 137  Recent Lipid Panel    Component Value Date/Time   CHOL 182 10/10/2023 1225   TRIG 110 10/10/2023 1225   HDL 42 10/10/2023 1225   CHOLHDL 4.4 04/26/2019 1705   CHOLHDL 4.4 12/22/2015 0905   VLDL 28 12/22/2015 0905   LDLCALC 120 (H) 10/10/2023 1225    Physical Exam:    Vital Signs: BP 130/88   Pulse 65   Ht 5' 5 (1.651 m)   Wt 199 lb (90.3 kg)   SpO2 94%   BMI 33.12 kg/m     Wt Readings from Last 3 Encounters:  12/29/23 199 lb (90.3 kg)  11/25/23 197 lb (89.4 kg)  11/05/23 195 lb (88.5 kg)  General: 48 y.o. male in no acute distress. HEENT: Normocephalic and atraumatic. Sclera clear.  Neck: Supple. No carotid bruits. No JVD. Heart: RRR. Distinct S1 and S2. No murmurs, gallops, or rubs.  Lungs: No increased work of breathing. Clear to ausculation bilaterally. No wheezes, rhonchi, or rales.  Extremities: No lower extremity edema.   Skin: Warm and dry. Neuro: No focal deficits. Psych: Normal affect. Responds appropriately.  Assessment:    1. Precordial pain   2. Palpitations   3. Primary hypertension   4. Dyslipidemia     Plan:    Chest Pain History of atypical chest pain over the years. ETT in 2019 was negative for ischemia. He was recently seen in the ED in 10/2023 for epigastric/ chest pain. EKG showed no acute ischemic changes and troponin was negative. Symptoms were felt to be more GI in nature.  - He has continued to have some mild atypical chest pain but much improved after starting Omeprazole . - EKG shows no acute ischemic changes. - Suspect chest pain is a likely GI in nature.  However, will order coronary CTA to rule out obstructive CAD and Echo to assess for structural heart disease.  Will get a BMP  today.  Will provide a one time dose of Lopressor 50 mg for patient to take 2 hours prior to CTA.  Palpitations  Patient reports intermittent episodes of palpitations that he describes as heart racing that lasts for 1 to 2 minutes at a time and occur couple times a week.  PCP recently ordered Zio monitor.  He finished wearing the monitor yesterday and mailed it back in but unable to see preliminary results yet. - Will wait for monitor results.  Hypertension BP borderline elevated at 130/88.  He states BP is typically in the 120-130s/ 80s at home. - Continue Amlodipine  2.5mg  daily.  - Asked patient to keep a log of BP/heart rate for 1 to 2 weeks and then send this to me via MyChart.  If BP consistently above goal of 130/80, will likely increase Amlodipine  to 5 mg daily.  Dyslipidemia Lipid panel in 09/2023 (per [facility]): Total Cholesterol 182, Triglycerides 110, HDL 42, LDL 120.  - ASCVD-10 year risk = 4.4% which is considered low risk. - If coronary CTA shows any CAD, will recommend statin. Otherwise, can continue with lifestyle modifications for now.  Disposition: Follow up in 2-3 months after above studies. If above studies are unremarkable, he can likely follow-up with us  as needed.    Signed, Aline FORBES Door, PA-C  12/29/2023 12:42 PM    [facility]

## 2023-12-29 ENCOUNTER — Other Ambulatory Visit: Payer: Self-pay

## 2023-12-29 ENCOUNTER — Ambulatory Visit: Payer: Self-pay | Attending: Cardiology | Admitting: Student

## 2023-12-29 ENCOUNTER — Encounter: Payer: Self-pay | Admitting: Student

## 2023-12-29 VITALS — BP 130/88 | HR 65 | Ht 65.0 in | Wt 199.0 lb

## 2023-12-29 DIAGNOSIS — R072 Precordial pain: Secondary | ICD-10-CM

## 2023-12-29 DIAGNOSIS — E785 Hyperlipidemia, unspecified: Secondary | ICD-10-CM

## 2023-12-29 DIAGNOSIS — R002 Palpitations: Secondary | ICD-10-CM

## 2023-12-29 DIAGNOSIS — I10 Essential (primary) hypertension: Secondary | ICD-10-CM

## 2023-12-29 MED ORDER — METOPROLOL TARTRATE 50 MG PO TABS
50.0000 mg | ORAL_TABLET | Freq: Once | ORAL | 0 refills | Status: AC
  Filled 2023-12-29: qty 1, 1d supply, fill #0

## 2023-12-29 NOTE — Patient Instructions (Signed)
 Medication Instructions:  Your physician recommends that you continue on your current medications as directed. Please refer to the Current Medication list given to you today.  *If you need a refill on your cardiac medications before your next appointment, please call your pharmacy*  Lab Work: TODAY:  BMET   If you have labs (blood work) drawn today and your tests are completely normal, you will receive your results only by: MyChart Message (if you have MyChart) OR A paper copy in the mail If you have any lab test that is abnormal or we need to change your treatment, we will call you to review the results.  Testing/Procedures: Your physician has requested that you have an echocardiogram. Echocardiography is a painless test that uses sound waves to create images of your heart. It provides your doctor with information about the size and shape of your heart and how well your heart's chambers and valves are working. This procedure takes approximately one hour. There are no restrictions for this procedure. Please do NOT wear cologne, perfume, aftershave, or lotions (deodorant is allowed). Please arrive 15 minutes prior to your appointment time.  Please note: We ask at that you not bring children with you during ultrasound (echo/ vascular) testing. Due to room size and safety concerns, children are not allowed in the ultrasound rooms during exams. Our front office staff cannot provide observation of children in our lobby area while testing is being conducted. An adult accompanying a patient to their appointment will only be allowed in the ultrasound room at the discretion of the ultrasound technician under special circumstances. We apologize for any inconvenience.   Your physician has requested that you have cardiac CT. Cardiac computed tomography (CT) is a painless test that uses an x-ray machine to take clear, detailed pictures of your heart. For further information please visit [email or web address]  Please follow instruction sheet BELOW:    Your cardiac CT will be scheduled at one of the below locations:   [facility] [address] [place], [place] [address] [phone] (Severe contrast allergies only)  OR   [facility] [address] [place], [place] [address] [phone]  OR   [facility] [address] [place], [place] [address] [phone]  OR   [facility] and Vascular Tower [address]  [place], [place] [address]  OR   [facility] [address] [place], [place] [phone]  If scheduled at [facility], please arrive at the [facility] and Children's Entrance (Entrance C2) of [facility] 30 minutes prior to test start time. You can use the FREE valet parking offered at entrance C (encouraged to control the heart rate for the test)  Proceed to the [facility] Radiology Department (first floor) to check-in and test prep.  All radiology patients and guests should use entrance C2 at [facility], accessed from [facility], even though the hospital's physical address listed is [address]  If scheduled at the Heart and Vascular Tower at [address], please enter the parking lot using the [address] entrance and use the FREE valet service at the patient drop-off area. Enter the building and check-in with registration on the main floor.  If scheduled at [facility], please arrive to the Heart and Vascular Center 15 mins early for check-in and test prep.  There is spacious parking and easy access to the radiology department from the [facility] Heart and Vascular  entrance. Please enter here and check-in with the desk attendant.   If scheduled at [facility], please arrive 30 minutes early for check-in and test prep.  Please follow these instructions carefully (unless otherwise directed):  An IV will be required  for this test and Nitroglycerin  will be given.  Hold all erectile dysfunction medications at least 3 days (72 hrs) prior to test. (Ie viagra, cialis, sildenafil, tadalafil, etc)   On the Night Before the Test: Be sure to Drink plenty of water . Do not consume any caffeinated/decaffeinated beverages or chocolate 12 hours prior to your test. Do not take any antihistamines 12 hours prior to your test.    On the Day of the Test: Drink plenty of water  until 1 hour prior to the test. Do not eat any food 1 hour prior to test. You may take your regular medications prior to the test.  Take metoprolol (Lopressor) 50 MG two hours prior to test.  THIS HAS BEEN SENT TO [facility]         After the Test: Drink plenty of water . After receiving IV contrast, you may experience a mild flushed feeling. This is normal. On occasion, you may experience a mild rash up to 24 hours after the test. This is not dangerous. If this occurs, you can take Benadryl  25 mg, Zyrtec , Claritin, or Allegra and increase your fluid intake. (Patients taking Tikosyn should avoid Benadryl , and may take Zyrtec , Claritin, or Allegra) If you experience trouble breathing, this can be serious. If it is severe call 911 IMMEDIATELY. If it is mild, please call our office.  We will call to schedule your test 2-4 weeks out understanding that some insurance companies will need an authorization prior to the service being performed.   For more information and frequently asked questions, please visit our website : [email or web address]  For non-scheduling related questions, please contact the cardiac imaging nurse navigator should you have any questions/concerns: Cardiac Imaging Nurse Navigators Direct Office Dial: [phone]   For scheduling needs, including cancellations and rescheduling, please call Brittany, [phone].   Follow-Up: At [facility], you and your health needs are our  priority.  As part of our continuing mission to provide you with exceptional heart care, our providers are all part of one team.  This team includes your primary Cardiologist (physician) and Advanced Practice Providers or APPs (Physician Assistants and Nurse Practitioners) who all work together to provide you with the care you need, when you need it.  Your next appointment:   2 month(s)  Provider:   Callie Goodrich, PA-C          We recommend signing up for the patient portal called MyChart.  Sign up information is provided on this After Visit Summary.  MyChart is used to connect with patients for Virtual Visits (Telemedicine).  Patients are able to view lab/test results, encounter notes, upcoming appointments, etc.  Non-urgent messages can be sent to your provider as well.   To learn more about what you can do with MyChart, go to [email or web address].   Other Instructions Your physician has requested that you regularly monitor and record your blood pressure readings at home. Please use the same machine at the same time of day to check your readings and record them to bring to your follow-up visit.   Please monitor blood pressures and keep a log of your readings for 1-2 weeks.  Send a mychart message to me with the readings.    Make sure  to check 2 hours after your medications.    AVOID these things for 30 minutes before checking your blood pressure: No Drinking caffeine. No Drinking alcohol. No Eating. No Smoking. No Exercising.   Five minutes before checking your blood pressure: Pee. Sit in a dining chair. Avoid sitting in a soft couch or armchair. Be quiet. Do not talk

## 2023-12-31 LAB — BASIC METABOLIC PANEL WITH GFR
BUN/Creatinine Ratio: 13 (ref 9–20)
BUN: 13 mg/dL (ref 6–24)
CO2: 23 mmol/L (ref 20–29)
Calcium: 9.2 mg/dL (ref 8.7–10.2)
Chloride: 102 mmol/L (ref 96–106)
Creatinine, Ser: 1.01 mg/dL (ref 0.76–1.27)
Glucose: 104 mg/dL — ABNORMAL HIGH (ref 70–99)
Potassium: 4.7 mmol/L (ref 3.5–5.2)
Sodium: 139 mmol/L (ref 134–144)
eGFR: 91 mL/min/1.73 (ref >59)

## 2024-01-01 ENCOUNTER — Ambulatory Visit: Payer: Self-pay | Admitting: Student

## 2024-01-03 DIAGNOSIS — R0789 Other chest pain: Secondary | ICD-10-CM

## 2024-01-04 ENCOUNTER — Ambulatory Visit: Payer: Self-pay | Admitting: Nurse Practitioner

## 2024-01-09 ENCOUNTER — Other Ambulatory Visit: Payer: Self-pay

## 2024-01-10 ENCOUNTER — Ambulatory Visit: Payer: Self-pay | Attending: Family Medicine | Admitting: Family Medicine

## 2024-01-18 ENCOUNTER — Other Ambulatory Visit: Payer: Self-pay

## 2024-02-06 ENCOUNTER — Ambulatory Visit (HOSPITAL_COMMUNITY): Payer: Self-pay | Attending: Student

## 2024-02-06 ENCOUNTER — Telehealth (HOSPITAL_COMMUNITY): Payer: Self-pay | Admitting: Student

## 2024-02-06 NOTE — Telephone Encounter (Signed)
 Patient NO SHOWED the echocardiogram scheduled for 02/06/24. We will not reach out to reschedule due to the NO SHOW RATE of 25%. If patient calls back we will reschedule. Order will be removed from the echo WQ. Thank you.
